# Patient Record
Sex: Female | Born: 1960 | Race: White | Hispanic: No | Marital: Married | Smoking: Never smoker
Health system: Southern US, Community
[De-identification: ages and names within clinical notes are randomized; demographics above are authoritative.]

## PROBLEM LIST (undated history)

## (undated) DIAGNOSIS — J189 Pneumonia, unspecified organism: Secondary | ICD-10-CM

## (undated) DIAGNOSIS — N809 Endometriosis, unspecified: Secondary | ICD-10-CM

## (undated) DIAGNOSIS — I456 Pre-excitation syndrome: Secondary | ICD-10-CM

## (undated) HISTORY — DX: Pre-excitation syndrome: I45.6

## (undated) HISTORY — DX: Endometriosis, unspecified: N80.9

## (undated) HISTORY — DX: Pneumonia, unspecified organism: J18.9

---

## 1998-08-05 ENCOUNTER — Encounter: Admission: RE | Admit: 1998-08-05 | Discharge: 1998-08-28 | Payer: Self-pay | Admitting: Internal Medicine

## 1998-11-17 ENCOUNTER — Encounter: Payer: Self-pay | Admitting: Internal Medicine

## 1998-11-17 ENCOUNTER — Ambulatory Visit (HOSPITAL_COMMUNITY): Admission: RE | Admit: 1998-11-17 | Discharge: 1998-11-17 | Payer: Self-pay | Admitting: Internal Medicine

## 1999-05-04 HISTORY — PX: OTHER SURGICAL HISTORY: SHX169

## 1999-05-28 ENCOUNTER — Emergency Department (HOSPITAL_COMMUNITY): Admission: EM | Admit: 1999-05-28 | Discharge: 1999-05-28 | Payer: Self-pay | Admitting: Emergency Medicine

## 1999-05-28 ENCOUNTER — Encounter: Payer: Self-pay | Admitting: Emergency Medicine

## 1999-09-03 ENCOUNTER — Other Ambulatory Visit: Admission: RE | Admit: 1999-09-03 | Discharge: 1999-09-03 | Payer: Self-pay | Admitting: *Deleted

## 1999-09-21 ENCOUNTER — Encounter: Payer: Self-pay | Admitting: *Deleted

## 1999-09-21 ENCOUNTER — Ambulatory Visit (HOSPITAL_COMMUNITY): Admission: RE | Admit: 1999-09-21 | Discharge: 1999-09-21 | Payer: Self-pay | Admitting: *Deleted

## 2000-10-21 ENCOUNTER — Ambulatory Visit (HOSPITAL_COMMUNITY): Admission: RE | Admit: 2000-10-21 | Discharge: 2000-10-21 | Payer: Self-pay | Admitting: Internal Medicine

## 2001-04-19 ENCOUNTER — Emergency Department (HOSPITAL_COMMUNITY): Admission: EM | Admit: 2001-04-19 | Discharge: 2001-04-19 | Payer: Self-pay

## 2001-05-03 HISTORY — PX: VAGINAL HYSTERECTOMY: SUR661

## 2001-05-23 ENCOUNTER — Other Ambulatory Visit: Admission: RE | Admit: 2001-05-23 | Discharge: 2001-05-23 | Payer: Self-pay | Admitting: *Deleted

## 2001-05-29 ENCOUNTER — Encounter: Admission: RE | Admit: 2001-05-29 | Discharge: 2001-05-29 | Payer: Self-pay | Admitting: *Deleted

## 2001-05-29 ENCOUNTER — Encounter: Payer: Self-pay | Admitting: *Deleted

## 2005-07-08 ENCOUNTER — Encounter: Admission: RE | Admit: 2005-07-08 | Discharge: 2005-07-08 | Payer: Self-pay | Admitting: *Deleted

## 2006-12-06 ENCOUNTER — Other Ambulatory Visit: Admission: RE | Admit: 2006-12-06 | Discharge: 2006-12-06 | Payer: Self-pay | Admitting: *Deleted

## 2008-03-26 ENCOUNTER — Ambulatory Visit: Payer: Self-pay | Admitting: Internal Medicine

## 2008-08-26 ENCOUNTER — Ambulatory Visit: Payer: Self-pay | Admitting: Family Medicine

## 2008-08-26 DIAGNOSIS — F39 Unspecified mood [affective] disorder: Secondary | ICD-10-CM | POA: Insufficient documentation

## 2008-08-26 DIAGNOSIS — F409 Phobic anxiety disorder, unspecified: Secondary | ICD-10-CM | POA: Insufficient documentation

## 2008-08-26 DIAGNOSIS — J309 Allergic rhinitis, unspecified: Secondary | ICD-10-CM | POA: Insufficient documentation

## 2008-09-11 ENCOUNTER — Ambulatory Visit: Payer: Self-pay | Admitting: Family Medicine

## 2008-09-11 DIAGNOSIS — G43909 Migraine, unspecified, not intractable, without status migrainosus: Secondary | ICD-10-CM | POA: Insufficient documentation

## 2008-09-17 ENCOUNTER — Telehealth (INDEPENDENT_AMBULATORY_CARE_PROVIDER_SITE_OTHER): Payer: Self-pay | Admitting: *Deleted

## 2008-10-23 ENCOUNTER — Ambulatory Visit: Payer: Self-pay | Admitting: Family Medicine

## 2008-10-23 ENCOUNTER — Encounter: Admission: RE | Admit: 2008-10-23 | Discharge: 2008-10-23 | Payer: Self-pay | Admitting: Family Medicine

## 2008-10-23 DIAGNOSIS — IMO0002 Reserved for concepts with insufficient information to code with codable children: Secondary | ICD-10-CM | POA: Insufficient documentation

## 2008-12-24 ENCOUNTER — Ambulatory Visit: Payer: Self-pay | Admitting: Family Medicine

## 2009-01-24 ENCOUNTER — Ambulatory Visit: Payer: Self-pay | Admitting: Family Medicine

## 2009-01-28 ENCOUNTER — Ambulatory Visit: Payer: Self-pay | Admitting: Family Medicine

## 2009-02-25 ENCOUNTER — Ambulatory Visit: Payer: Self-pay | Admitting: Family Medicine

## 2009-02-26 ENCOUNTER — Encounter: Payer: Self-pay | Admitting: Family Medicine

## 2009-03-17 ENCOUNTER — Ambulatory Visit: Payer: Self-pay | Admitting: Family Medicine

## 2009-05-09 ENCOUNTER — Ambulatory Visit: Payer: Self-pay | Admitting: Family Medicine

## 2009-05-16 ENCOUNTER — Telehealth: Payer: Self-pay | Admitting: Family Medicine

## 2009-06-17 ENCOUNTER — Ambulatory Visit: Payer: Self-pay | Admitting: Family Medicine

## 2009-06-30 ENCOUNTER — Ambulatory Visit: Payer: Self-pay | Admitting: Family Medicine

## 2009-06-30 DIAGNOSIS — M549 Dorsalgia, unspecified: Secondary | ICD-10-CM | POA: Insufficient documentation

## 2009-07-16 ENCOUNTER — Ambulatory Visit: Payer: Self-pay | Admitting: Family Medicine

## 2009-07-23 ENCOUNTER — Encounter: Payer: Self-pay | Admitting: Family Medicine

## 2009-12-05 IMAGING — CR DG SCAPULA*R*
2 series · 2 of 2 positions shown · non-contrast
Comparison: None

CLINICAL DATA: Injury.  Sudden onset of pain in right scapula
today.

RIGHT SCAPULA

[view not recorded (1 of 2)]
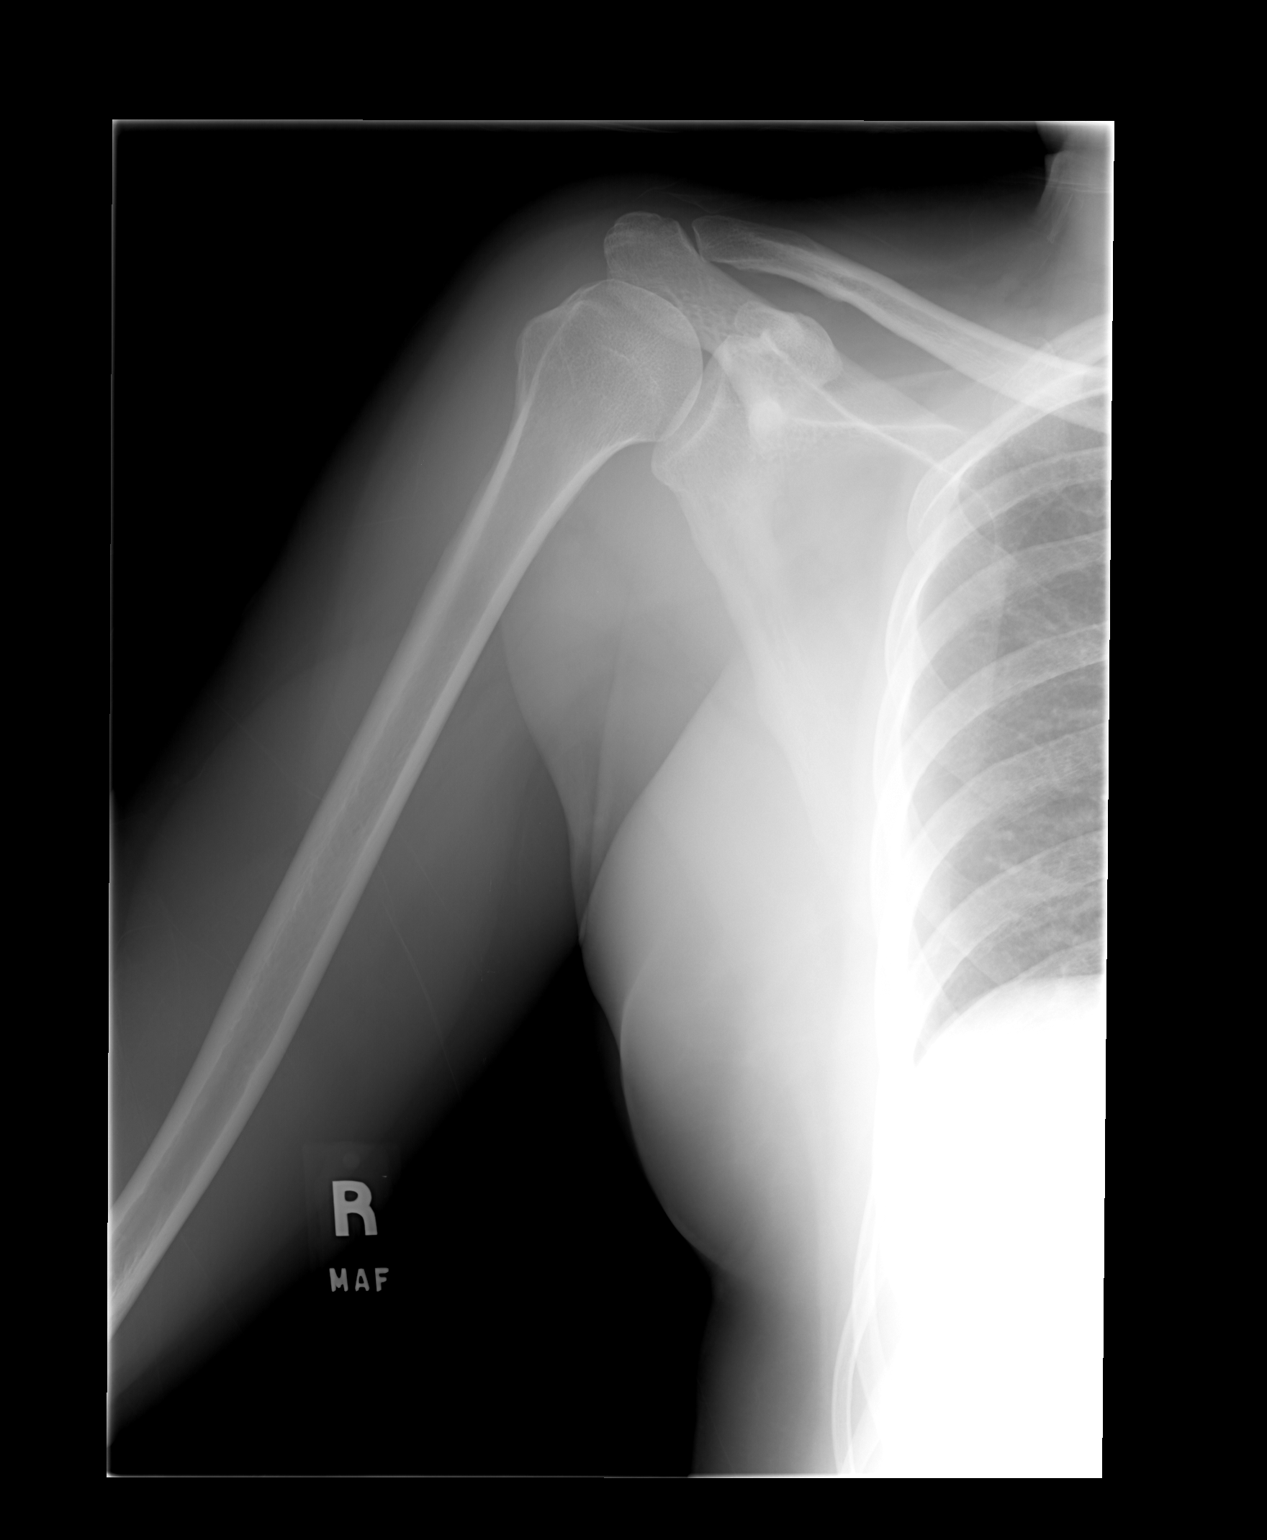

[view not recorded (2 of 2)]
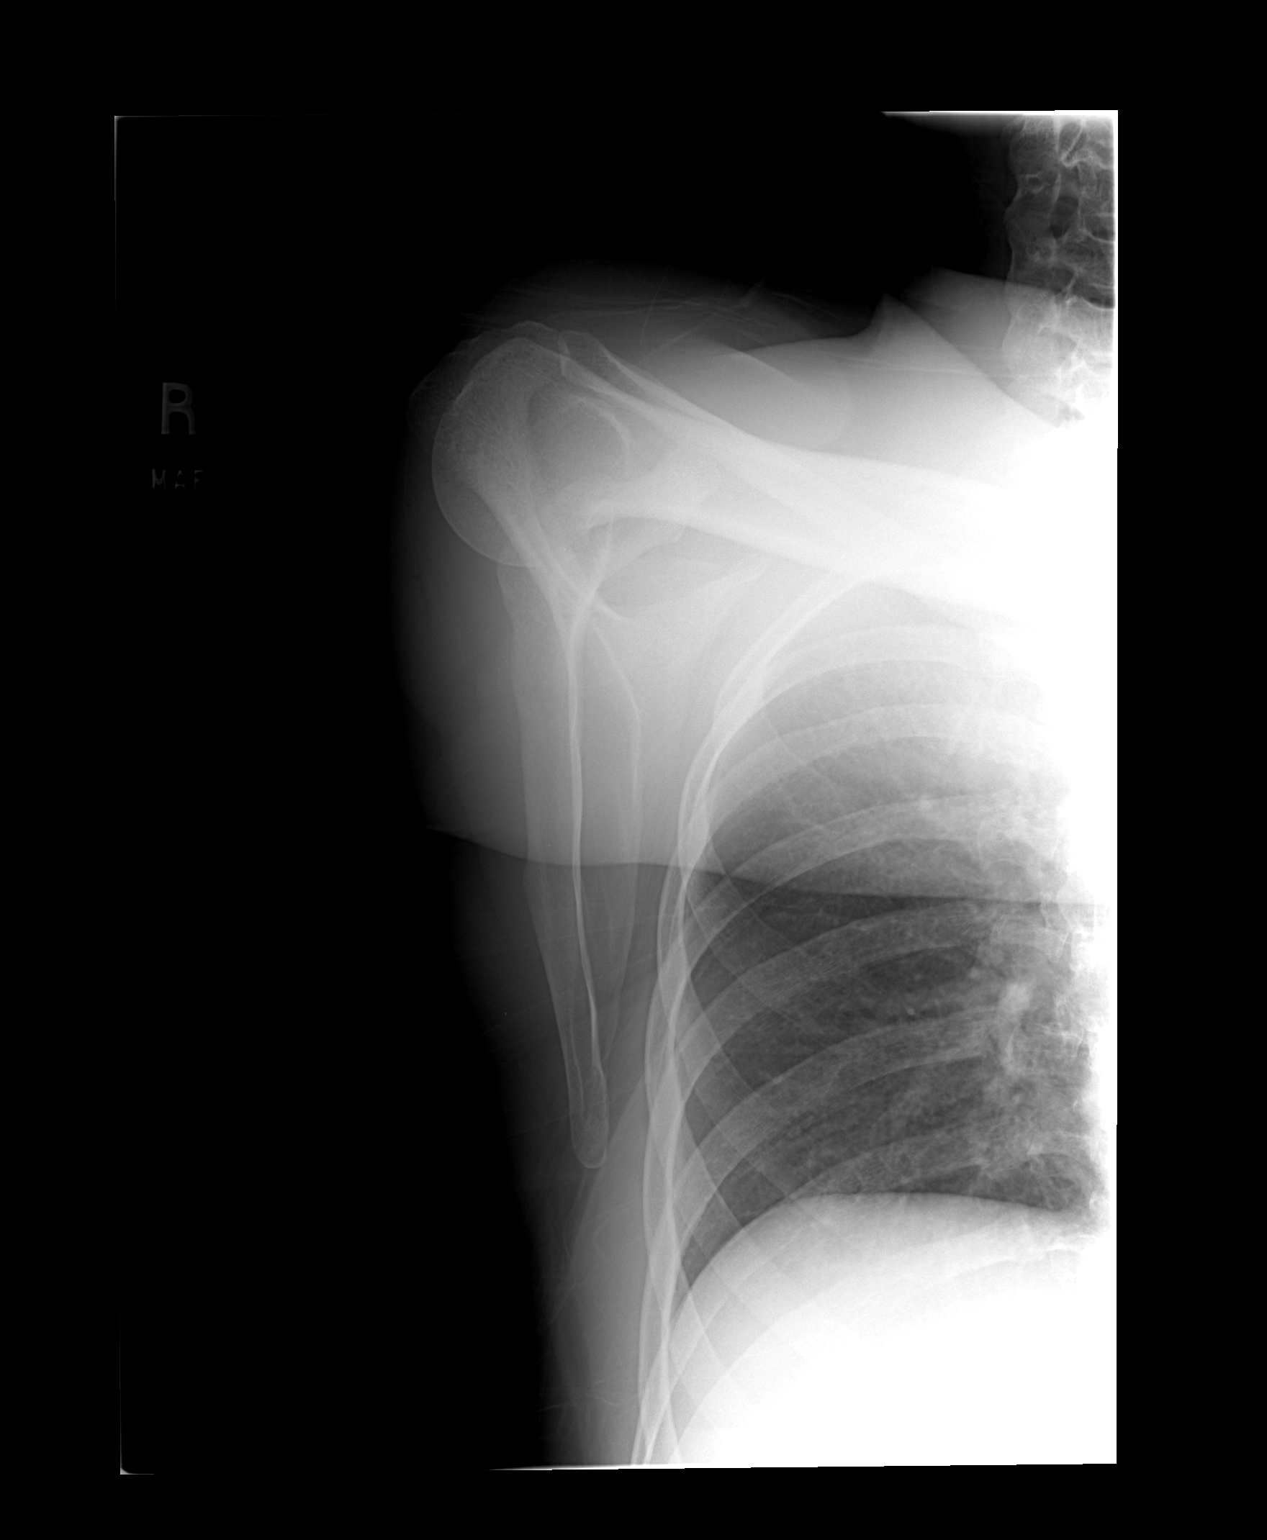

[2 of 2 positions shown; findings below may reference images not displayed]

FINDINGS: No fracture or dislocation.
IMPRESSION: Negative right scapula.

## 2010-01-12 ENCOUNTER — Ambulatory Visit: Payer: Self-pay | Admitting: Family Medicine

## 2010-01-12 ENCOUNTER — Encounter
Admission: RE | Admit: 2010-01-12 | Discharge: 2010-01-12 | Payer: Self-pay | Source: Home / Self Care | Admitting: Family Medicine

## 2010-01-14 ENCOUNTER — Telehealth: Payer: Self-pay | Admitting: Family Medicine

## 2010-01-14 ENCOUNTER — Encounter (INDEPENDENT_AMBULATORY_CARE_PROVIDER_SITE_OTHER): Payer: Self-pay | Admitting: *Deleted

## 2010-01-27 ENCOUNTER — Ambulatory Visit: Payer: Self-pay | Admitting: Family Medicine

## 2010-02-24 ENCOUNTER — Ambulatory Visit: Payer: Self-pay | Admitting: Family Medicine

## 2010-04-20 ENCOUNTER — Ambulatory Visit: Payer: Self-pay | Admitting: Family Medicine

## 2010-04-20 ENCOUNTER — Encounter
Admission: RE | Admit: 2010-04-20 | Discharge: 2010-04-20 | Payer: Self-pay | Source: Home / Self Care | Attending: Family Medicine | Admitting: Family Medicine

## 2010-04-20 DIAGNOSIS — M79609 Pain in unspecified limb: Secondary | ICD-10-CM | POA: Insufficient documentation

## 2010-04-21 ENCOUNTER — Encounter (INDEPENDENT_AMBULATORY_CARE_PROVIDER_SITE_OTHER): Payer: Self-pay | Admitting: *Deleted

## 2010-04-29 ENCOUNTER — Ambulatory Visit
Admission: RE | Admit: 2010-04-29 | Discharge: 2010-04-29 | Payer: Self-pay | Source: Home / Self Care | Attending: Family Medicine | Admitting: Family Medicine

## 2010-05-01 ENCOUNTER — Encounter: Payer: Self-pay | Admitting: Family Medicine

## 2010-05-24 ENCOUNTER — Encounter: Payer: Self-pay | Admitting: Internal Medicine

## 2010-05-26 ENCOUNTER — Ambulatory Visit
Admission: RE | Admit: 2010-05-26 | Discharge: 2010-05-26 | Payer: Self-pay | Source: Home / Self Care | Admitting: Emergency Medicine

## 2010-05-27 ENCOUNTER — Encounter: Payer: Self-pay | Admitting: Emergency Medicine

## 2010-05-29 ENCOUNTER — Telehealth (INDEPENDENT_AMBULATORY_CARE_PROVIDER_SITE_OTHER): Payer: Self-pay | Admitting: *Deleted

## 2010-06-03 HISTORY — PX: FOOT SURGERY: SHX648

## 2010-06-04 NOTE — Assessment & Plan Note (Signed)
Summary: Upper back pain f/u.    Vital Signs:  Patient profile:   50 year old female Height:      69 inches Weight:      201 pounds Pulse rate:   84 / minute BP sitting:   130 / 82  (right arm) Cuff size:   regular  Vitals Entered By: Avon Gully CMA, Duncan Dull) (January 27, 2010 2:04 PM) CC: f/u back pain    Primary Care Provider:  Nani Gasser MD  CC:  f/u back pain .  History of Present Illness:  Pain in the neck and to the right shoulder. Arm is not as painful.  would like to see a chiropracter, Dr. Gerre Couch.  Feels about 40% better with rest and medications.  Using the Advil primarily. Occ using the med at bedtime.  Out of the tamezepam but refilled yesterday.   Current Medications (verified): 1)  Temazepam 30 Mg Caps (Temazepam) .... Take 1 Tablet By Mouth Once A Day As Needed Sleep 2)  Allegra 180 Mg Tabs (Fexofenadine Hcl) .... Take 1 Tablet By Mouth Once A Day 3)  Maxalt 10 Mg Tabs (Rizatriptan Benzoate) .... Use As Needed For Migraines 4)  Phenergan 25mg /0.40ml Gel .... Apply 0.11ml  To Wrist Every 4-6 Hours As Needed Nausea 5)  Flexeril 10 Mg Tabs (Cyclobenzaprine Hcl) .... 1/2 To 1 Tab At Bedtime As Needed Muscle Strain. 6)  Hydrocodone-Acetaminophen 5-325 Mg Tabs (Hydrocodone-Acetaminophen) .Marland Kitchen.. 1-2 Tabs By Mouth Three Times A Day As Needed For Severe Pain.  Allergies (verified): 1)  ! Biaxin 2)  ! Imitrex  Comments:  Nurse/Medical Assistant: The patient's medications and allergies were reviewed with the patient and were updated in the Medication and Allergy Lists. Avon Gully CMA, Duncan Dull) (January 27, 2010 2:06 PM)  Physical Exam  General:  Well-developed,well-nourished,in no acute distress; alert,appropriate and cooperative throughout examination Msk:  Neck with NROM. Right shoulder with slight dec internal rotation compared to the lef. Tender over the right trap and along the medial edge of the scapula.    Impression &  Recommendations:  Problem # 1:  BACK PAIN, UPPER (ICD-724.5) Will refer to chiropracter. I also think she would benefit from massage. If not progressing then consider PT or orhto evalution.  Refill her hydrocodone once more time. She is not really using the flexeril.  Can return to work.  The following medications were removed from the medication list:    Flexeril 10 Mg Tabs (Cyclobenzaprine hcl) .Marland Kitchen... 1/2 to 1 tab at bedtime as needed muscle strain. Her updated medication list for this problem includes:    Hydrocodone-acetaminophen 5-325 Mg Tabs (Hydrocodone-acetaminophen) .Marland Kitchen... 1-2 tabs by mouth three times a day as needed for severe pain.  Orders: Chiropractic Referral ([facility]) T-Culture, Urine [phone])  Complete Medication List: 1)  Temazepam 30 Mg Caps (Temazepam) .... Take 1 tablet by mouth once a day as needed sleep 2)  Allegra 180 Mg Tabs (Fexofenadine hcl) .... Take 1 tablet by mouth once a day 3)  Maxalt 10 Mg Tabs (Rizatriptan benzoate) .... Use as needed for migraines 4)  Phenergan 25mg /0.51ml Gel  .... Apply 0.87ml  to wrist every 4-6 hours as needed nausea 5)  Hydrocodone-acetaminophen 5-325 Mg Tabs (Hydrocodone-acetaminophen) .Marland Kitchen.. 1-2 tabs by mouth three times a day as needed for severe pain. Prescriptions: HYDROCODONE-ACETAMINOPHEN 5-325 MG TABS (HYDROCODONE-ACETAMINOPHEN) 1-2 tabs by mouth three times a day as needed for severe pain.  #45 x 0   Entered and Authorized by:   Nani Gasser MD  Signed by:   Nani Gasser MD on 01/27/2010   Method used:   Printed then faxed to ...       [facility] [phone]* (retail)       [address] [place], [place]  [address]       Ph: [phone]       Fax: [phone]   RxID:   [id number]

## 2010-06-04 NOTE — Letter (Signed)
Summary: Out of Work  [facility]  [address]   [place], [place] [address]   Phone: [phone]  Fax: [phone]    January 27, 2010   Employee:  ADALENE GULOTTA    To Whom It May Concern:   For Medical reasons, she can return to work 01-28-2010  If you need additional information, please feel free to contact our office.         Sincerely,    Nani Gasser MD

## 2010-06-04 NOTE — Assessment & Plan Note (Signed)
Summary: POSSIBLE UTI?   Vital Signs:  Patient Profile:   50 Years Old Female CC:      Dysuria, vomiting, LBP x 2 days Height:     69 inches Weight:      190 pounds O2 Sat:      100 % O2 treatment:    Room Air Temp:     97.9 degrees F oral Pulse rate:   63 / minute Pulse rhythm:   regular Resp:     16 per minute BP sitting:   124 / 79  (left arm) Cuff size:   regular  Vitals Entered By: Emilio Math (May 26, 2010 6:21 PM)                  Current Allergies: ! BIAXIN ! IMITREXHistory of Present Illness History from: patient Chief Complaint: Dysuria, vomiting, LBP x 2 days History of Present Illness: 50 Years Old Female complains of UTI symptoms for 1 day.  She describes the pain as burning during urination.  Azo helps a little bit. + dysuria + frequency + urgency No hematuria No vaginal discharge + fever/chills + lower abdomenal pain + back pain No fatigue  + nausea  REVIEW OF SYSTEMS Constitutional Symptoms      Denies fever, chills, night sweats, weight loss, weight gain, and fatigue.  Eyes       Denies change in vision, eye pain, eye discharge, glasses, contact lenses, and eye surgery. Ear/Nose/Throat/Mouth       Denies hearing loss/aids, change in hearing, ear pain, ear discharge, dizziness, frequent runny nose, frequent nose bleeds, sinus problems, sore throat, hoarseness, and tooth pain or bleeding.  Respiratory       Denies dry cough, productive cough, wheezing, shortness of breath, asthma, bronchitis, and emphysema/COPD.  Cardiovascular       Denies murmurs, chest pain, and tires easily with exhertion.    Gastrointestinal       Complains of nausea/vomiting.      Denies stomach pain, diarrhea, constipation, blood in bowel movements, and indigestion. Genitourniary       Complains of painful urination.      Denies kidney stones and loss of urinary control. Neurological       Denies paralysis, seizures, and fainting/blackouts. Musculoskeletal  Denies muscle pain, joint pain, joint stiffness, decreased range of motion, redness, swelling, muscle weakness, and gout.  Skin       Denies bruising, unusual mles/lumps or sores, and hair/skin or nail changes.  Psych       Denies mood changes, temper/anger issues, anxiety/stress, speech problems, depression, and sleep problems.  Past History:  Past Medical History: Reviewed history from 08/26/2008 and no changes required. 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 but [facility] has now retired.   Past Surgical History: Reviewed history from 08/26/2008 and no changes required. Hysterectomy  01/1997  Family History: Reviewed history from 08/26/2008 and no changes required. Uncle with MI  Social History: Reviewed history from 02/25/2009 and no changes required. Working at [employer] for [employer] at [employer]. Previously worked at [employer]. BS in Business Admin.  Married to [place] with 2 adult kids.   Never Smoked Drug use-no Regular exercise-yes, walking.  Alcohol use-no Physical Exam General appearance: well developed, well nourished, mild distress Chest/Lungs: no rales, wheezes, or rhonchi bilateral, breath sounds equal without effort Heart: regular rate and  rhythm, no murmur Abdomen:  mild suprapubic tenderness, no guarding, no rebound Back: mild Bil CVAT Assessment New Problems: URINARY TRACT INFECTION (ICD-599.0)   Plan New Medications/Changes: PHENAZOPYRIDINE HCL 200 MG TABS (PHENAZOPYRIDINE HCL) 1 by mouth three times a day for 2 days  #6 x 0, 05/26/2010, Hoyt Koch MD CIPROFLOXACIN HCL 250 MG TABS (CIPROFLOXACIN HCL) 1 by mouth two times a day for 5 days  #10 x 0, 05/26/2010, Hoyt Koch MD  New Orders: Est. Patient Level III [56213] UA Dipstick w/o Micro (automated)  [81003] T-Culture, Urine  [[id number]] Planning Comments:   Hydration, rest, Motrin as needed, [place] Follow-up with your primary care physician if not improving or if getting worse   The patient and/or caregiver has been counseled thoroughly with regard to medications prescribed including dosage, schedule, interactions, rationale for use, and possible side effects and they verbalize understanding.  Diagnoses and expected course of recovery discussed and will return if not improved as expected or if the condition worsens. Patient and/or caregiver verbalized understanding.  Prescriptions: PHENAZOPYRIDINE HCL 200 MG TABS (PHENAZOPYRIDINE HCL) 1 by mouth three times a day for 2 days  #6 x 0   Entered and Authorized by:   Hoyt Koch MD   Signed by:   Hoyt Koch MD on 05/26/2010   Method used:   Handwritten   RxID:   [id number] CIPROFLOXACIN HCL 250 MG TABS (CIPROFLOXACIN HCL) 1 by mouth two times a day for 5 days  #10 x 0   Entered and Authorized by:   Hoyt Koch MD   Signed by:   Hoyt Koch MD on 05/26/2010   Method used:   Handwritten   RxID:   [id number]   Orders Added: 1)  Est. Patient Level III [63875] 2)  UA Dipstick w/o Micro (automated)  [81003] 3)  T-Culture, Urine [[id number]]

## 2010-06-04 NOTE — Assessment & Plan Note (Signed)
Summary: Lt. Ankle swollen   Vital Signs:  Patient profile:   50 year old female Height:      69 inches Weight:      198 pounds Pulse rate:   74 / minute BP sitting:   93 / 58  (right arm) Cuff size:   regular  Vitals Entered By: Avon Gully CMA, Duncan Dull) (April 20, 2010 3:16 PM) CC: left ankle swollen and painful x 2 months   Primary Care Daxter Paule:  Nani Gasser MD  CC:  left ankle swollen and painful x 2 months.  History of Present Illness: left ankle swollen and painful x 2 months. Swelling  on the outside of left the ankle.   Says occ the area turns blue. GEts really swollen by the end of the day. PUt ICE on it and taking Advil.  Works 6 days a week and has to stand on her feet.  Fells stiff. Feels like she is dragging her foot around.  No trauma or injury.  No old injuries or fracuturs. Hx of arthroscopy of the left knee but years ago. It is better when she elevates the foot.;   Current Medications (verified): 1)  Temazepam 30 Mg Caps (Temazepam) .... Take 1 Tablet By Mouth Once A Day As Needed Sleep 2)  Allegra 180 Mg Tabs (Fexofenadine Hcl) .... Take 1 Tablet By Mouth Once A Day 3)  Maxalt 10 Mg Tabs (Rizatriptan Benzoate) .... Use As Needed For Migraines 4)  Phenergan 25mg /0.38ml Gel .... Apply 0.38ml  To Wrist Every 4-6 Hours As Needed Nausea  Allergies (verified): 1)  ! Biaxin 2)  ! Imitrex  Comments:  Nurse/Medical Assistant: The patient's medications and allergies were reviewed with the patient and were updated in the Medication and Allergy Lists. Avon Gully CMA, Duncan Dull) (April 20, 2010 3:17 PM)  Social History: Reviewed history from 02/25/2009 and no changes required. Working at [employer] for [employer] at [employer]. Previously worked at [employer]. BS in Business Admin.  Married to [place] with 2 adult kids.   Never Smoked Drug use-no Regular exercise-yes, walking.  Alcohol use-no  Physical Exam  General:  Well-developed,well-nourished,in no acute  distress; alert,appropriate and cooperative throughout examination Msk:  Left ankle with slightly dec flexion. Pain with all ROM against resistance. Some mild swelling over teh lateral malleolus. she is tender over the outside of the ankle. No bruising.    Impression & Recommendations:  Problem # 1:  ANKLE PAIN, LEFT (ICD-719.47) Will get an xray to rule out fracture PRICE for now. REally ther ankle looks like  a sprain. No sign of gout adn the 2 months hx is inconsistant. Can use Advil as needed.  Orders: T-DG Ankle Complete*L* (57846)  Complete Medication List: 1)  Temazepam 30 Mg Caps (Temazepam) .... Take 1 tablet by mouth once a day as needed sleep 2)  Allegra 180 Mg Tabs (Fexofenadine hcl) .... Take 1 tablet by mouth once a day 3)  Maxalt 10 Mg Tabs (Rizatriptan benzoate) .... Use as needed for migraines 4)  Phenergan 25mg /0.55ml Gel  .... Apply 0.26ml  to wrist every 4-6 hours as needed nausea  Patient Instructions: 1)  Elevate, ACE wrap, and continue to ice it.  2)  We will call you with the xray results.    Orders Added: 1)  T-DG Ankle Complete*L* [73610] 2)  Est. Patient Level IV [96295]

## 2010-06-04 NOTE — Letter (Signed)
Summary: Out of Work  [facility]  [address]   [place], [place] [address]   Phone: [phone]  Fax: [phone]    July 16, 2009   Employee:  ALECIA DOI    To Whom It May Concern:   For Medical reasons, please excuse the above named employee from work for the following dates:  Start:   07/15/2009  End:   07/16/2009  If you need additional information, please feel free to contact our office.         Sincerely,    Nani Gasser, MD

## 2010-06-04 NOTE — Consult Note (Signed)
Summary: [facility]  [facility]   Imported By: Lanelle Bal 05/27/2010 09:54:51  _____________________________________________________________________  External Attachment:    Type:   Image     Comment:   External Document

## 2010-06-04 NOTE — Letter (Signed)
Summary: Generic Letter  [facility]  [address]   [place], [place] [address]   Phone: [phone]  Fax: [phone]    04/21/2010  NELI FOFANA [address] [place], [place]  [address]  Dear Ms. Vlachos,    Please allow this patient to be off of her feet as much as possible for the next two weeks. If you have questions please call our office at [phone].       Sincerely,   Nani Gasser, MD

## 2010-06-04 NOTE — Assessment & Plan Note (Signed)
Summary: Upper back pain   Vital Signs:  Patient profile:   50 year old female Height:      69 inches Weight:      201 pounds Pulse rate:   80 / minute BP sitting:   126 / 83  (left arm) Cuff size:   regular  Vitals Entered By: Avon Gully CMA, Duncan Dull) (January 12, 2010 11:47 AM) CC: hurt back at work yesterday lifting a drawer, rt shoulder and arm hurt Pain Assessment Patient in pain? yes     Location: upper back Intensity: 9 Type: burning   Primary Care Miho Monda:  Nani Gasser MD  CC:  hurt back at work yesterday lifting a drawer and rt shoulder and arm hurt.  History of Present Illness: hurt back at work yesterday lifting a drawer, rt shoulder and arm hurt.  Works at the [employer], operational.  Draw fell out so tried to pick it up and felt something immedialy.  Used Advil and iced it all not.  Getting burning sensation in the right shoulder down to her elbow. Some tinlging in her right hand and feels like his her funny bond.  Painful when turns to the right.     Current Medications (verified): 1)  Temazepam 30 Mg Caps (Temazepam) .... Take 1 Tablet By Mouth Once A Day As Needed Sleep 2)  Allegra 180 Mg Tabs (Fexofenadine Hcl) .... Take 1 Tablet By Mouth Once A Day 3)  Maxalt 10 Mg Tabs (Rizatriptan Benzoate) .... Use As Needed For Migraines 4)  Phenergan 25mg /0.64ml Gel .... Apply 0.65ml  To Wrist Every 4-6 Hours As Needed Nausea  Allergies (verified): 1)  ! Biaxin 2)  ! Imitrex  Comments:  Nurse/Medical Assistant: The patient's medications and allergies were reviewed with the patient and were updated in the Medication and Allergy Lists. Avon Gully CMA, Duncan Dull) (January 12, 2010 11:48 AM)  Physical Exam  General:  Well-developed,well-nourished,in no acute distress; alert,appropriate and cooperative throughout examination Msk:  TEnder over teh upper thoracic spine and the right paraspinous muscles. TEnder over the lateral edge of acromium. Nontender  elbow and wrist. DEc internal rotation.  + empty can test on the right.  Shoudler extension to about 90 degree.  Strength shoulder 4/4, elbow and wrsit 5/5.     Impression & Recommendations:  Problem # 1:  BACK PAIN, UPPER (ICD-724.5) Discussed likey MSK but may have rotator cuff syndrome. Given tramadol in the office for pain relief. Off work today.  New rx for flexeril and as needed hydrocodone. Can use the IBU.  Work on heat and gentle stretches.  Xray tor rule out fracture or large disc herniation.  The following medications were removed from the medication list:    Hydrocodone-acetaminophen 5-500 Mg Tabs (Hydrocodone-acetaminophen) .Marland Kitchen... 1-2 tabs by mouth every 6 hours as needed for severe pain. Her updated medication list for this problem includes:    Flexeril 10 Mg Tabs (Cyclobenzaprine hcl) .Marland Kitchen... 1/2 to 1 tab at bedtime as needed muscle strain.    Hydrocodone-acetaminophen 5-325 Mg Tabs (Hydrocodone-acetaminophen) .Marland Kitchen... 1-2 tabs by mouth three times a day as needed for severe pain.  Orders: T-DG Cervical Spine 2-3 Views [phone]) T-DG Thoracic Spine 2 Views [phone]) T-DG Shoulder*R* [phone]) Ketorolac-Toradol 15mg  (N5621)  Complete Medication List: 1)  Temazepam 30 Mg Caps (Temazepam) .... Take 1 tablet by mouth once a day as needed sleep 2)  Allegra 180 Mg Tabs (Fexofenadine hcl) .... Take 1 tablet by mouth once a day 3)  Maxalt 10 Mg Tabs (  Rizatriptan benzoate) .... Use as needed for migraines 4)  Phenergan 25mg /0.50ml Gel  .... Apply 0.41ml  to wrist every 4-6 hours as needed nausea 5)  Flexeril 10 Mg Tabs (Cyclobenzaprine hcl) .... 1/2 to 1 tab at bedtime as needed muscle strain. 6)  Hydrocodone-acetaminophen 5-325 Mg Tabs (Hydrocodone-acetaminophen) .Marland Kitchen.. 1-2 tabs by mouth three times a day as needed for severe pain.  Other Orders: Flu Vaccine 76yrs + ([address]) Admin 1st Vaccine ([address])  Patient Instructions: 1)  Continue your ibuprofen 600mg  by mouth three times a day wth  food 2)  Try the muscle relacer in the evenings 3)  Can use the hydrocodone as needed for severe pain. Be careful of sedation.  4)  F/U in 2 weeks.  5)  Apply warm moist heat for 15 min 3-4 x a day.  6)  Gentle stretches.  Prescriptions: HYDROCODONE-ACETAMINOPHEN 5-325 MG TABS (HYDROCODONE-ACETAMINOPHEN) 1-2 tabs by mouth three times a day as needed for severe pain.  #45 x 0   Entered and Authorized by:   Nani Gasser MD   Signed by:   Nani Gasser MD on 01/12/2010   Method used:   Printed then faxed to ...       [facility] [phone]* (retail)       [address] [place], [place]  [address]       Ph: [phone]       Fax: [phone]   RxID:   [id number] FLEXERIL 10 MG TABS (CYCLOBENZAPRINE HCL) 1/2 to 1 tab at bedtime as needed muscle strain.  #30 x 0   Entered and Authorized by:   Nani Gasser MD   Signed by:   Nani Gasser MD on 01/12/2010   Method used:   Electronically to        [facility] [phone]* (retail)       [address] [place], [place]  [address]       Ph: [phone]       Fax: [phone]   RxID:   [id number]    Medication Administration  Injection # 1:    Medication: Ketorolac-Toradol 15mg     Diagnosis: BACK PAIN, UPPER (ICD-724.5)    Route: IM    Site: RUOQ gluteus    Exp Date: 04/03/2011    Lot #: [id number]    Mfr: Hospira    Comments: 60mg /ml given    Patient tolerated injection without complications    Given by: Avon Gully CMA, Duncan Dull) (January 12, 2010 12:17 PM)  Orders Added: 1)  T-DG Cervical Spine 2-3 Views [72040] 2)  T-DG Thoracic Spine 2 Views [72070] 3)  T-DG Shoulder*R* [73030] 4)  Ketorolac-Toradol 15mg  [J1885] 5)  Flu Vaccine 67yrs + [90[address]] 6)  Admin 1st Vaccine [90471] 7)  Est. Patient Level IV [35573]    Immunizations Administered:  Influenza Vaccine # 1:    Vaccine Type: Fluvax 3+    Site: left deltoid    Mfr: Fluarix    Dose: 0.5 ml    Route: IM     Given by: Sue Lush McCrimmon CMA, (AAMA)    Exp. Date: 10/31/2010    Lot #: [id number]    VIS given: 11/25/09 version given January 12, 2010.  Flu Vaccine Consent Questions:    Do you have a history of severe allergic reactions to this vaccine? no    Any prior history of allergic reactions to egg and/or gelatin? no  Do you have a sensitivity to the preservative Thimersol? no    Do you have a past history of Guillan-Barre Syndrome? no    Do you currently have an acute febrile illness? no    Have you ever had a severe reaction to latex? no    Vaccine information given and explained to patient? no    Are you currently pregnant? no

## 2010-06-04 NOTE — Assessment & Plan Note (Signed)
Summary: migraine   Vital Signs:  Patient profile:   50 year old female Height:      69 inches Weight:      203 pounds BMI:     30.09 O2 Sat:      100 % on Room air Temp:     98.0 degrees F oral Pulse rate:   62 / minute BP sitting:   120 / 82 Cuff size:   regular  Vitals Entered By: Jessica Alexander CMA/april (June 17, 2009 3:17 PM)  O2 Flow:  Room air CC: c/o migraine HA, N/V x 2 days   Primary Care Provider:  Nani Gasser MD  CC:  c/o migraine HA and N/V x 2 days.  History of Present Illness: 50 yo WF presents for a migraine HA that she woke up with yesterday.  She took 2 Maxalts yesterday and had N/V and took 2 today.  It has not helped.  She ran out of her phenergan.  She has been on imipramine at bedtime for migraine prevention.  She used to see Jessica Alexander.  Avging 4 migraines/ month which usually improve on Maxalt.     Current Medications (verified): 1)  Temazepam 30 Mg Caps (Temazepam) .... Take 1 Tablet By Mouth Once A Day As Needed Sleep 2)  Allegra 180 Mg Tabs (Fexofenadine Hcl) .... Take 1 Tablet By Mouth Once A Day 3)  Imipramine Hcl 50 Mg Tabs (Imipramine Hcl) .... Take 1 Tablet By Mouth Once A Day At Bedtime 4)  Pristiq 50 Mg Xr24h-Tab (Desvenlafaxine Succinate) .Marland Kitchen.. 1 By Mouth Daily 5)  Maxalt 10 Mg Tabs (Rizatriptan Benzoate) .... Use As Needed For Migraines 6)  Promethazine Hcl 25 Mg Tabs (Promethazine Hcl) .... One By Mouth Every 6 Hours As Needed Nausea/vomiting.  Allergies (verified): 1)  ! Biaxin 2)  ! Imitrex  Past History:  Past Medical History: Reviewed history from 08/26/2008 and no changes required. Current Problems:  MOOD DISORDER (ICD-296.90) ALLERGIC RHINITIS (ICD-477.9) MIGRAINE W/AURA W/O INTRACT W/O STATUS MIGRNOSUS (ICD-346.00) - Previoulsy saw Jessica[facility] but he has now retired.   Social History: Reviewed history from 02/25/2009 and no changes required. Working at [employer] for [employer] at  [employer]. Previously worked at [employer]. BS in Business Admin.  Married to Jessica Alexander with 2 adult kids.   Never Smoked Drug use-no Regular exercise-yes, walking.  Alcohol use-no  Review of Systems      See HPI  Physical Exam  General:  alert, well-developed, well-nourished, and well-hydrated.   Head:  normocephalic and atraumatic.   Eyes:  photosensitive Nose:  no nasal discharge.   Mouth:  pharynx pink and moist.   Neck:  no masses.   Lungs:  Normal respiratory effort, chest expands symmetrically. Lungs are clear to auscultation, no crackles or wheezes. Heart:  Normal rate and regular rhythm. S1 and S2 normal without gallop, murmur, click, rub or other extra sounds. Skin:  color normal.   Psych:  flat affect.     Impression & Recommendations:  Problem # 1:  MIGRAINE HEADACHE (ICD-346.90)  Day 2 of severe migraine.  Treat with 60 mg IM Toradol injection today.  RX for Zofran ODT given for as needed nausea.  She is to stay on Imipramine at bedtime for migraine prevention with as needed use of Maxalt.  If she wakes up tomorrow with severe HA, I will put her on a 5 day Prednisone taper.   Her updated medication list for this problem includes:  Maxalt 10 Mg Tabs (Rizatriptan benzoate) ..... Use as needed for migraines  Orders: Ketorolac-Toradol 15mg  (E4540) Admin of Therapeutic Inj  intramuscular or subcutaneous (98119)  Complete Medication List: 1)  Temazepam 30 Mg Caps (Temazepam) .... Take 1 tablet by mouth once a day as needed sleep 2)  Allegra 180 Mg Tabs (Fexofenadine hcl) .... Take 1 tablet by mouth once a day 3)  Imipramine Hcl 50 Mg Tabs (Imipramine hcl) .... Take 1 tablet by mouth once a day at bedtime 4)  Pristiq 50 Mg Xr24h-tab (Desvenlafaxine succinate) .Marland Kitchen.. 1 by mouth daily 5)  Maxalt 10 Mg Tabs (Rizatriptan benzoate) .... Use as needed for migraines 6)  Zofran Odt 8 Mg Tbdp (Ondansetron) .Marland Kitchen.. 1 tab by mouth q 8 hrs as needed nausea  Patient Instructions: 1)  Toradol  injection today for acute migraine. 2)  Go home and rest.  3)  Use dissolvable zofran as needed for nausea. 4)  If HA recurs, you can take a Maxalt. 5)  Call me in the morning if you wake up with a bad HA.  Prescriptions: ZOFRAN ODT 8 MG TBDP (ONDANSETRON) 1 tab by mouth q 8 hrs as needed nausea  #24 x 1   Entered and Authorized by:   Seymour Bars DO   Signed by:   Seymour Bars DO on 06/17/2009   Method used:   Electronically to        [facility] [phone]* (retail)       [address]       [place], [place]  [address]       Ph: [phone]       Fax: [phone]   RxID:   [id number]    Medication Administration  Injection # 1:    Medication: Ketorolac-Toradol 15mg     Diagnosis: MIGRAINE HEADACHE (ICD-346.90)    Route: IM    Site: LUOQ gluteus    Exp Date: 12/02/2010    Lot #: [id number]    Comments: 60mg     Patient tolerated injection without complications    Given by: Jessica Alexander CMA (June 17, 2009 4:06 PM)  Orders Added: 1)  Est. Patient Level III [34742] 2)  Ketorolac-Toradol 15mg  [J1885] 3)  Admin of Therapeutic Inj  intramuscular or subcutaneous [59563]

## 2010-06-04 NOTE — Letter (Signed)
Summary: Out of Work  [facility]  [address], [address]   [place], [place] [address]   Phone: [phone]  Fax: [phone]    June 17, 2009   Employee:  KRYSTAN NORTHROP    To Whom It May Concern:   For Medical reasons, please excuse the above named employee from work for the following dates:  Start:   Feb 14th - 15th  End:   Feb 16th  If you need additional information, please feel free to contact our office.         Sincerely,    Seymour Bars DO

## 2010-06-04 NOTE — Consult Note (Signed)
Summary: [facility]  [facility]   Imported By: Lanelle Bal 05/27/2010 09:58:20  _____________________________________________________________________  External Attachment:    Type:   Image     Comment:   External Document

## 2010-06-04 NOTE — Letter (Signed)
Summary: Work Excuse  [facility]  [address] [place] [address], [address]   [place], [place] [address]   Phone: [phone]  Fax: [phone]    Today's Date: May 09, 2009  Name of Patient: Jessica Alexander  The above named patient had a medical visit today  on [id number]  Please take this into consideration when reviewing the time away from work/school.    Special Instructions:  [  ] None  [  ] To be off the remainder of today, returning to the normal work / school schedule tomorrow.  [  ] To be off until the next scheduled appointment on ______________________.  [  ] Other ________________________________________________________________ ________________________________________________________________________   Sincerely yours,   Nani Gasser MD

## 2010-06-04 NOTE — Letter (Signed)
Summary: Out of Work  [facility]  [address]   [place], [place] [address]   Phone: [phone]  Fax: [phone]    February 24, 2010   Employee:  ISHI DANSER    To Whom It May Concern:   For Medical reasons, please excuse the above named employee from work this afternoon.    If you need additional information, please feel free to contact our office.         Sincerely,    Donna Christen MD

## 2010-06-04 NOTE — Progress Notes (Signed)
Summary: need meds called in  Phone Note Call from Patient   Caller: Patient Summary of Call: Dr.Kirtan Sada     Call Back   [phone]  Patient said she has been Vomiting and having Diarrhea since yesterday morning--5 am.   Been out of work and want to know if something could be called in.      [facility].    Patient asked could she get a call back if this could or couldn't be done. Initial call taken by: Vanessa Swaziland,  May 16, 2009 9:07 AM  Follow-up for Phone Call        Can send in phenergan. Does she want tabs or suppositories?  Follow-up by: Nani Gasser MD,  May 16, 2009 10:45 AM  Additional Follow-up for Phone Call Additional follow up Details #1::        patient said she want the pills Additional Follow-up by: Vanessa Swaziland,  May 16, 2009 11:45 AM    New/Updated Medications: PROMETHAZINE HCL 25 MG TABS (PROMETHAZINE HCL) one by mouth every 6 hours as needed nausea/vomiting. Prescriptions: PROMETHAZINE HCL 25 MG TABS (PROMETHAZINE HCL) one by mouth every 6 hours as needed nausea/vomiting.  #6 x 0   Entered and Authorized by:   Nani Gasser MD   Signed by:   Nani Gasser MD on 05/16/2009   Method used:   Electronically to        [facility] [phone]* (retail)       [address]       [place], [place]  [address]       Ph: [phone]       Fax: [phone]   RxID:   [id number]

## 2010-06-04 NOTE — Consult Note (Signed)
Summary: [facility]  [facility]   Imported By: Lanelle Bal 08/06/2009 14:05:25  _____________________________________________________________________  External Attachment:    Type:   Image     Comment:   External Document

## 2010-06-04 NOTE — Progress Notes (Signed)
Summary: work note  Phone Note Call from Patient   Caller: Patient Call For: Nani Gasser MD Summary of Call: Pt called and states she was not feeling well and was going to call into work and wants to be written out for today Initial call taken by: Avon Gully CMA, Duncan Dull),  January 14, 2010 1:51 PM  Follow-up for Phone Call        taht is fine. OK for note for today.  Follow-up by: Nani Gasser MD,  January 14, 2010 2:17 PM  Additional Follow-up for Phone Call Additional follow up Details #1::        note printed and left up front.left message for pt Additional Follow-up by: Avon Gully CMA, Duncan Dull),  January 14, 2010 4:11 PM

## 2010-06-04 NOTE — Progress Notes (Signed)
  Phone Note Outgoing Call   Call placed by: Clemens Catholic LPN,  May 29, 2010 8:30 AM Call placed to: Patient Summary of Call: call back: left message urine culture positive, complete ABT and call back if any questions or concerns. Initial call taken by: Clemens Catholic LPN,  May 29, 2010 8:31 AM

## 2010-06-04 NOTE — Assessment & Plan Note (Signed)
Summary: acute sinusitis   Vital Signs:  Patient profile:   50 year old female Height:      69 inches Weight:      203 pounds O2 Sat:      100 % on Room air Temp:     98.0 degrees F oral Pulse rate:   80 / minute BP sitting:   117 / 71  (left arm) Cuff size:   regular  Vitals Entered By: Kathlene November (May 09, 2009 2:04 PM)  O2 Flow:  Room air CC: head and sinus congestion, nasal drainage, chest congestion, H/A, pressure, ears popping, hurts to touch head. Started before Christmas   Primary Care Provider:  Nani Gasser MD  CC:  head and sinus congestion, nasal drainage, chest congestion, H/A, pressure, ears popping, and hurts to touch head. Started before Christmas.  History of Present Illness: head and sinus congestion, nasal drainage, chest congestion, H/A, pressure, ears popping, hurts to touch head. Started 12/26. Started with a sever cough so used nyquail and cough medicine.  Hurts to brush her hair and put on her glasses. EArs are constantly popping. Yellow discharge form the nose.  Advil cold and sinus. Using nose drops.  No fever. + chills. No GI sxs.  No wheezing or SOB.   Current Medications (verified): 1)  Temazepam 30 Mg Caps (Temazepam) .... Take 1 Tablet By Mouth Once A Day As Needed Sleep 2)  Allegra 180 Mg Tabs (Fexofenadine Hcl) .... Take 1 Tablet By Mouth Once A Day 3)  Imipramine Hcl 50 Mg Tabs (Imipramine Hcl) .... Take 1 Tablet By Mouth Once A Day At Bedtime 4)  Pristiq 50 Mg Xr24h-Tab (Desvenlafaxine Succinate) .Marland Kitchen.. 1 By Mouth Daily 5)  Maxalt 10 Mg Tabs (Rizatriptan Benzoate) .... Use As Needed For Migraines  Allergies (verified): 1)  ! Biaxin 2)  ! Imitrex  Comments:  Nurse/Medical Assistant: The patient's medications and allergies were reviewed with the patient and were updated in the Medication and Allergy Lists. Kathlene November (May 09, 2009 2:06 PM)  Physical Exam  General:  Well-developed,well-nourished,in no acute distress;  alert,appropriate and cooperative throughout examination Head:  Normocephalic and atraumatic without obvious abnormalities. No apparent alopecia or balding. Eyes:  No corneal or conjunctival inflammation noted. EOMI. Perrla.  Ears:  External ear exam shows no significant lesions or deformities.  Otoscopic examination reveals clear canals, tympanic membranes are intact bilaterally without bulging, retraction, inflammation or discharge. Hearing is grossly normal bilaterally. Nose:  External nasal examination shows no deformity or inflammation. Nasal mucosa are pink and moist without lesions or exudates. Mouth:  Oral mucosa and oropharynx without lesions or exudates.  Teeth in good repair. Op mildy erythematous.  Neck:  No deformities, masses, or tenderness noted. Lungs:  Normal respiratory effort, chest expands symmetrically. Lungs are clear to auscultation, no crackles or wheezes. Heart:  Normal rate and regular rhythm. S1 and S2 normal without gallop, murmur, click, rub or other extra sounds. Pulses:  Radial 2+  Skin:  no rashes.   Cervical Nodes:  No lymphadenopathy noted Psych:  Cognition and judgment appear intact. Alert and cooperative with normal attention span and concentration. No apparent delusions, illusions, hallucinations   Impression & Recommendations:  Problem # 1:  SINUSITIS - ACUTE-NOS (ICD-461.9)  Her updated medication list for this problem includes:    Hydrocodone-homatropine 5-1.5 Mg/1ml Syrp (Hydrocodone-homatropine) .Marland KitchenMarland KitchenMarland KitchenMarland Kitchen 5ml by mouth at up to 3 x a day for cough.    Amoxicillin 500 Mg Cap (Amoxicillin) .Marland Kitchen... Take  1 capsule by mouth three times a day x 10 days  Instructed on treatment. Call if symptoms persist or worsen.   Complete Medication List: 1)  Temazepam 30 Mg Caps (Temazepam) .... Take 1 tablet by mouth once a day as needed sleep 2)  Allegra 180 Mg Tabs (Fexofenadine hcl) .... Take 1 tablet by mouth once a day 3)  Imipramine Hcl 50 Mg Tabs (Imipramine hcl)  .... Take 1 tablet by mouth once a day at bedtime 4)  Pristiq 50 Mg Xr24h-tab (Desvenlafaxine succinate) .Marland Kitchen.. 1 by mouth daily 5)  Maxalt 10 Mg Tabs (Rizatriptan benzoate) .... Use as needed for migraines 6)  Hydrocodone-homatropine 5-1.5 Mg/12ml Syrp (Hydrocodone-homatropine) .... 5ml by mouth at up to 3 x a day for cough. 7)  Amoxicillin 500 Mg Cap (Amoxicillin) .... Take 1 capsule by mouth three times a day x 10 days Prescriptions: HYDROCODONE-HOMATROPINE 5-1.5 MG/5ML SYRP (HYDROCODONE-HOMATROPINE) 5ml by mouth at up to 3 x a day for cough.  #191ml x 0   Entered and Authorized by:   Nani Gasser MD   Signed by:   Nani Gasser MD on 05/09/2009   Method used:   Printed then faxed to ...       [facility] [phone]* (retail)       [address] [place], [place]  [address]       Ph: [phone]       Fax: [phone]   RxID:   [id number] AMOXICILLIN 500 MG CAP (AMOXICILLIN) Take 1 capsule by mouth three times a day X 10 days  #30 x 0   Entered and Authorized by:   Nani Gasser MD   Signed by:   Nani Gasser MD on 05/09/2009   Method used:   Electronically to        [facility] [phone]* (retail)       [address]       [place], [place]  [address]       Ph: [phone]       Fax: [phone]   RxID:   [id number]

## 2010-06-04 NOTE — Letter (Signed)
Summary: Out of Work  [facility]  [address]   [place], [place] [address]   Phone: [phone]  Fax: [phone]    January 14, 2010   Employee:  Jessica Alexander    To Whom It May Concern:   For Medical reasons, please excuse the above named employee from work for the following dates:  Start:   01/14/10  End:   01/14/10  If you need additional information, please feel free to contact our office.         Sincerely,    Nani Gasser, MD

## 2010-06-04 NOTE — Assessment & Plan Note (Signed)
Summary: Left foot pain   Vital Signs:  Patient profile:   50 year old female Height:      69 inches Weight:      199 pounds Pulse rate:   74 / minute BP sitting:   118 / 77  (right arm) Cuff size:   regular  Vitals Entered By: Avon Gully CMA, Duncan Dull) (April 29, 2010 9:43 AM) CC: ankle swollen and painful, used asce wrap but hasnt been able to stay off feet    Primary Care Breaker Springer:  Nani Gasser MD  CC:  ankle swollen and painful and used asce wrap but hasnt been able to stay off feet .  History of Present Illness: ankle swollen and painful, used asce wrap but hasnt been able to stay off feet. Bottoms of left foot is still so painful that she is having a hard time walking. Now feels a know and swelling on the bottom of the arch. Also sometimes her ankle and "whole foot "will swell. Yesterday was so painful was hardly able to walk. Ketp it elevated and wrapped with no relief.   Current Medications (verified): 1)  Temazepam 30 Mg Caps (Temazepam) .... Take 1 Tablet By Mouth Once A Day As Needed Sleep 2)  Allegra 180 Mg Tabs (Fexofenadine Hcl) .... Take 1 Tablet By Mouth Once A Day 3)  Maxalt 10 Mg Tabs (Rizatriptan Benzoate) .... Use As Needed For Migraines 4)  Phenergan 25mg /0.60ml Gel .... Apply 0.4ml  To Wrist Every 4-6 Hours As Needed Nausea  Allergies (verified): 1)  ! Biaxin 2)  ! Imitrex  Comments:  Nurse/Medical Assistant: The patient's medications and allergies were reviewed with the patient and were updated in the Medication and Allergy Lists. Avon Gully CMA, Duncan Dull) (April 29, 2010 9:44 AM)  Physical Exam  General:  Well-developed,well-nourished,in no acute distress; alert,appropriate and cooperative throughout examination Msk:  ON the bottom of her foot I feel about a 1 cm nodule along the base of hte arch that is extremely tender. Some mild swelling around the area but no erythema. Mild swelling over the lateral malleolus.    Impression  & Recommendations:  Problem # 1:  FOOT PAIN, LEFT (ICD-729.5) Before this was mostly her ankle but now has a lesion on the bottom of her foot that I think is the true source of her pain. Will refer to podiatry. Consider cyst on the tendon.  May be amenable to injection. Really her pain is severe. Recommend stay out of work and stay off her foot until can get in with podiatry for further tx.  Cna use Motrin or Tylenol for pain. Can stop wrapping the area.  Orders: Podiatry Referral (Podiatry)  Complete Medication List: 1)  Temazepam 30 Mg Caps (Temazepam) .... Take 1 tablet by mouth once a day as needed sleep 2)  Allegra 180 Mg Tabs (Fexofenadine hcl) .... Take 1 tablet by mouth once a day 3)  Maxalt 10 Mg Tabs (Rizatriptan benzoate) .... Use as needed for migraines 4)  Phenergan 25mg /0.43ml Gel  .... Apply 0.32ml  to wrist every 4-6 hours as needed nausea  Patient Instructions: 1)  We wil call you with your podiatry referral.    Orders Added: 1)  Podiatry Referral [Podiatry] 2)  Est. Patient Level III [[address]]

## 2010-06-04 NOTE — Assessment & Plan Note (Signed)
Summary: Upper back pain, acute   Vital Signs:  Patient profile:   50 year old female Height:      69 inches Weight:      201 pounds Pulse rate:   65 / minute BP sitting:   115 / 72  (left arm) Cuff size:   regular  Vitals Entered By: Kathlene November (June 30, 2009 2:23 PM) CC: upper back pain- caught mom falling yesterday and has been hurting ever since   Primary Care Provider:  Nani Gasser MD  CC:  upper back pain- caught mom falling yesterday and has been hurting ever since.  History of Present Illness: upper back pain- caught mom falling yesterday and has been hurting ever since.  Pain is mostly inbetween her shoulder blades, raditaint up into her neck. Taking Advil 4 tabs every 4-6 hours.  ON heating pad all night long.  Injury occurred yesterday. Almost feels worse today. No old injuries. Hurts to sit. feels better standing.  Hurst to raise arms abover her shoulders.   Current Medications (verified): 1)  Temazepam 30 Mg Caps (Temazepam) .... Take 1 Tablet By Mouth Once A Day As Needed Sleep 2)  Allegra 180 Mg Tabs (Fexofenadine Hcl) .... Take 1 Tablet By Mouth Once A Day 3)  Imipramine Hcl 50 Mg Tabs (Imipramine Hcl) .... Take 1 Tablet By Mouth Once A Day At Bedtime 4)  Maxalt 10 Mg Tabs (Rizatriptan Benzoate) .... Use As Needed For Migraines 5)  Zofran Odt 8 Mg Tbdp (Ondansetron) .Marland Kitchen.. 1 Tab By Mouth Q 8 Hrs As Needed Nausea  Allergies (verified): 1)  ! Biaxin 2)  ! Imitrex  Comments:  Nurse/Medical Assistant: The patient's medications and allergies were reviewed with the patient and were updated in the Medication and Allergy Lists. Kathlene November (June 30, 2009 2:24 PM)  Physical Exam  General:  Well-developed,well-nourished,in no acute distress; alert,appropriate and cooperative throughout examination Head:  Normocephalic and atraumatic without obvious abnormalities. No apparent alopecia or balding. Msk:  Flexion of low back. to about 90 degress,  and  extension normal.  Normal side bending.  Decreased rotation to the left compared the right. Decreased neck flexion secondary to pain.  Normal extension, rotation of the neck and side bending.  Tender over mid thoracic spine and especially the left paraspinous muscles over the mid thoracid spine. Traps are very tight and midly tender. Shoulders with NROM but painful to lift completley above her head.  Pulses:  Radial 2+    Impression & Recommendations:  Problem # 1:  BACK PAIN, UPPER (ICD-724.5) Assessment New Most likley MSK strain thought she was tender over the spine so if not getting better by the end fo the week then will get xray to further eval to rule out fracture. Continue NSAID. Reviewed appropriate dose as she is taking too much.  Add muscle relaxer and hydrocodone at bedtime for acute relief. Given H.O. on upper back stretches. Call if not better by end of week but expect it to take several weeks to get completely better.   Her updated medication list for this problem includes:    Flexeril 10 Mg Tab (Cyclobenzaprine hcl) .Marland Kitchen... Take 1 tablet by mouth at bedtime    Hydrocodone-acetaminophen 5-500 Mg Tabs (Hydrocodone-acetaminophen) .Marland Kitchen... 1-2 tabs by mouth every 6 hours as needed for severe pain.  Complete Medication List: 1)  Temazepam 30 Mg Caps (Temazepam) .... Take 1 tablet by mouth once a day as needed sleep 2)  Allegra 180 Mg Tabs (Fexofenadine hcl) .Marland KitchenMarland KitchenMarland Kitchen  Take 1 tablet by mouth once a day 3)  Imipramine Hcl 50 Mg Tabs (Imipramine hcl) .... Take 1 tablet by mouth once a day at bedtime 4)  Maxalt 10 Mg Tabs (Rizatriptan benzoate) .... Use as needed for migraines 5)  Zofran Odt 8 Mg Tbdp (Ondansetron) .Marland Kitchen.. 1 tab by mouth q 8 hrs as needed nausea 6)  Flexeril 10 Mg Tab (Cyclobenzaprine hcl) .... Take 1 tablet by mouth at bedtime 7)  Hydrocodone-acetaminophen 5-500 Mg Tabs (Hydrocodone-acetaminophen) .Marland Kitchen.. 1-2 tabs by mouth every 6 hours as needed for severe pain.  Patient  Instructions: 1)  ADvill 4 tabs up to 3 x a day with food 2)  Don't use heaiting pad for more than 20 minutes at one time. 3)  Stretches at least 2-3 x a day.  4)  Avoid sitting for too long a period.  5)  Can use the flexeril (muscle relaxer) once get home. Can use the hydrocodone at bedtime.   6)  Call if not making progress over the next week.  7)  Expect several weeks to get better.  Prescriptions: HYDROCODONE-ACETAMINOPHEN 5-500 MG TABS (HYDROCODONE-ACETAMINOPHEN) 1-2 tabs by mouth every 6 hours as needed for severe pain.  #15 x 0   Entered and Authorized by:   Nani Gasser MD   Signed by:   Nani Gasser MD on 06/30/2009   Method used:   Printed then faxed to ...       [facility] [phone]* (retail)       [address] [place], [place]  [address]       Ph: [phone]       Fax: [phone]   RxID:   [id number] FLEXERIL 10 MG TAB (CYCLOBENZAPRINE HCL) Take 1 tablet by mouth at bedtime  #30 x 0   Entered and Authorized by:   Nani Gasser MD   Signed by:   Nani Gasser MD on 06/30/2009   Method used:   Electronically to        [facility] [phone]* (retail)       [address]       [place], [place]  [address]       Ph: [phone]       Fax: [phone]   RxID:   [id number]

## 2010-06-04 NOTE — Assessment & Plan Note (Signed)
Summary: POSSIBEL URI/COUGH/CONGESTION Room 5   Vital Signs:  Patient Profile:   50 Years Old Female CC:      Congestion, sinus problems, cough yellow-green x8 days Height:     69 inches Weight:      198 pounds O2 Sat:      99 % O2 treatment:    Room Air Temp:     98.4 degrees F oral Pulse rate:   83 / minute Pulse rhythm:   regular Resp:     18 per minute BP sitting:   127 / 82  (left arm) Cuff size:   regular  Vitals Entered By: Emilio Math (February 24, 2010 2:51 PM)                  Current Allergies (reviewed today): ! BIAXIN ! IMITREXHistory of Present Illness Chief Complaint: Congestion, sinus problems, cough yellow-green x8 days History of Present Illness:  Subjective: Patient complains of URI symptoms that started 8 days ago.  Developed a cough about 4 days ago.  Cough is worse at night and awakens her. Initial sore throat resolved No pleuritic pain No wheezing + mild nasal congestion + post-nasal drainage No sinus pain/pressure No itchy/red eyes No earache No hemoptysis No SOB No fever, + chills No nausea No vomiting No abdominal pain No diarrhea No skin rashes + fatigue No myalgias No headache Used OTC meds without relief   Current Meds TEMAZEPAM 30 MG CAPS (TEMAZEPAM) Take 1 tablet by mouth once a day as needed sleep ALLEGRA 180 MG TABS (FEXOFENADINE HCL) Take 1 tablet by mouth once a day MAXALT 10 MG TABS (RIZATRIPTAN BENZOATE) use as needed for migraines * PHENERGAN 25MG /0.25ML GEL Apply 0.12ml  to wrist every 4-6 hours as needed nausea BENZONATATE 200 MG CAPS (BENZONATATE) One by mouth hs as needed cough CEFDINIR 300 MG CAPS (CEFDINIR) 1 by mouth q12hr (Rx void after 03/03/10)  REVIEW OF SYSTEMS Constitutional Symptoms      Denies fever, chills, night sweats, weight loss, weight gain, and fatigue.  Eyes       Denies change in vision, eye pain, eye discharge, glasses, contact lenses, and eye surgery. Ear/Nose/Throat/Mouth  Complains of sinus problems and hoarseness.      Denies hearing loss/aids, change in hearing, ear pain, ear discharge, dizziness, frequent runny nose, frequent nose bleeds, sore throat, and tooth pain or bleeding.  Respiratory       Complains of productive cough.      Denies dry cough, wheezing, shortness of breath, asthma, bronchitis, and emphysema/COPD.  Cardiovascular       Denies murmurs, chest pain, and tires easily with exhertion.    Gastrointestinal       Denies stomach pain, nausea/vomiting, diarrhea, constipation, blood in bowel movements, and indigestion. Genitourniary       Denies painful urination, kidney stones, and loss of urinary control. Neurological       Denies paralysis, seizures, and fainting/blackouts. Musculoskeletal       Denies muscle pain, joint pain, joint stiffness, decreased range of motion, redness, swelling, muscle weakness, and gout.  Skin       Denies bruising, unusual mles/lumps or sores, and hair/skin or nail changes.  Psych       Denies mood changes, temper/anger issues, anxiety/stress, speech problems, depression, and sleep problems.  Past History:  Past Medical History: Reviewed history from 08/26/2008 and no changes required. 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 but [facility] has now retired.   Past Surgical History: Reviewed history from 08/26/2008 and no changes required. Hysterectomy  01/1997  Family History: Reviewed history from 08/26/2008 and no changes required. Uncle with MI  Social History: Reviewed history from 02/25/2009 and no changes required. Working at [employer] for [employer] at [employer]. Previously worked at [employer]. BS in Business Admin.  Married to [place] with 2 adult kids.   Never Smoked Drug use-no Regular exercise-yes, walking.  Alcohol use-no   Objective:    Appearance:  Patient appears healthy, stated age, and in no acute distress  Eyes:  Pupils are equal, round, and reactive to light and accomdation.  Extraocular movement is intact.  Conjunctivae are not inflamed.  Ears:  Canals normal.  Tympanic membranes normal.   Nose:  Normal septum.  Normal turbinates, mildly congested.  No sinus tenderness present.  Pharynx:  Normal  Neck:  Supple.  No adenopathy is present.  No thyromegaly is present  Lungs:  Clear to auscultation.  Breath sounds are equal.  Heart:  Regular rate and rhythm without murmurs, rubs, or gallops.  Abdomen:  Nontender without masses or hepatosplenomegaly.  Bowel sounds are present.  No CVA or flank tenderness.  Assessment New Problems: VIRAL URI (ICD-465.9)   Plan New Medications/Changes: CEFDINIR 300 MG CAPS (CEFDINIR) 1 by mouth q12hr (Rx void after 03/03/10)  #20 x 0, 02/24/2010, Donna Christen MD BENZONATATE 200 MG CAPS (BENZONATATE) One by mouth hs as needed cough  #12 x 0, 02/24/2010, Donna Christen MD  New Orders: Est. Patient Level IV [16109] Planning Comments:   Treat symptomatically for now:  Increase fluid intake, begin expectorant/decongestant, cough suppressant.  If fever/chills/sweats persist, or if not improving 5 to 7 days begin Omnicef (given Rx to hold).  Followup with PCP if not improving 7 to 10 days.   The patient and/or caregiver has been counseled thoroughly with regard to medications prescribed including dosage, schedule, interactions, rationale for use, and possible side effects and they verbalize understanding.  Diagnoses and expected course of recovery discussed and will return if not improved as expected or if the condition worsens. Patient and/or caregiver verbalized understanding.  Prescriptions: CEFDINIR 300 MG CAPS (CEFDINIR) 1 by mouth q12hr (Rx void after 03/03/10)  #20 x 0   Entered and Authorized by:   Donna Christen MD   Signed by:   Donna Christen MD on 02/24/2010   Method used:   Print  then Give to Patient   RxID:   [id number] BENZONATATE 200 MG CAPS (BENZONATATE) One by mouth hs as needed cough  #12 x 0   Entered and Authorized by:   Donna Christen MD   Signed by:   Donna Christen MD on 02/24/2010   Method used:   Print then Give to Patient   RxID:   [id number]   Patient Instructions: 1)  May use Mucinex D (guaifenesin with decongestant) twice daily for congestion. 2)  Increase fluid intake, rest. 3)  May use Afrin nasal spray (or generic oxymetazoline) twice daily for about 5 days.  Also recommend using saline nasal spray several times daily and/or saline nasal irrigation. 4)  Begin Cefdinir (Omnicef) if not improving 5 days or if fever develops 5)  Followup with family doctor if not improving 7 to 10 days.  Orders Added: 1)  Est. Patient Level IV [29528]

## 2010-06-04 NOTE — Assessment & Plan Note (Signed)
Summary: Acute migraine   Vital Signs:  Patient profile:   50 year old female Height:      69 inches Weight:      203 pounds Temp:     97.9 degrees F oral Pulse rate:   82 / minute BP sitting:   116 / 69  (left arm) Cuff size:   regular  Vitals Entered By: Kathlene November (July 16, 2009 10:14 AM) CC: migraine with vomiting since Monday- can not keep anything down, chills, Headache   Primary Care Provider:  Nani Gasser MD  CC:  migraine with vomiting since Monday- can not keep anything down, chills, and Headache.  History of Present Illness: migraine with vomiting since Monday- can not keep anything down, chills.  No known triggers. Tried maxalt. Also taking odansatron. Pain is 12/10. Not taken anything today.  No diarrhea. + Lightheadedness.  having one severe HA a month. Sometimes 4-5 x amonth has alow grade one. Using at least 6 maxalt a month. Noramlly sleeps well. Very stressed out.    Was here a month ago for an acute migraine and saw Dr. Cathey Endow.   Current Medications (verified): 1)  Temazepam 30 Mg Caps (Temazepam) .... Take 1 Tablet By Mouth Once A Day As Needed Sleep 2)  Allegra 180 Mg Tabs (Fexofenadine Hcl) .... Take 1 Tablet By Mouth Once A Day 3)  Imipramine Hcl 50 Mg Tabs (Imipramine Hcl) .... Take 1 Tablet By Mouth Once A Day At Bedtime 4)  Maxalt 10 Mg Tabs (Rizatriptan Benzoate) .... Use As Needed For Migraines 5)  Zofran Odt 8 Mg Tbdp (Ondansetron) .Marland Kitchen.. 1 Tab By Mouth Q 8 Hrs As Needed Nausea 6)  Hydrocodone-Acetaminophen 5-500 Mg Tabs (Hydrocodone-Acetaminophen) .Marland Kitchen.. 1-2 Tabs By Mouth Every 6 Hours As Needed For Severe Pain.  Allergies (verified): 1)  ! Biaxin 2)  ! Imitrex  Comments:  Nurse/Medical Assistant: The patient's medications and allergies were reviewed with the patient and were updated in the Medication and Allergy Lists. Kathlene November (July 16, 2009 10:15 AM)  Physical Exam  General:  Well-developed,well-nourished,in no acute distress;  alert,appropriate and cooperative throughout examination. Appears fatigued. Squinting her eyes.  Head:  Normocephalic and atraumatic without obvious abnormalities. No apparent alopecia or balding. Eyes:  No corneal or conjunctival inflammation noted. EOMI. Perrla.  Lungs:  Normal respiratory effort, chest expands symmetrically. Lungs are clear to auscultation, no crackles or wheezes. Heart:  Normal rate and regular rhythm. S1 and S2 normal without gallop, murmur, click, rub or other extra sounds. Neurologic:  alert & oriented X3, cranial nerves II-XII intact, gait normal, and DTRs symmetrical and normal.   Skin:  no rashes.   Psych:  Cognition and judgment appear intact. Alert and cooperative with normal attention span and concentration. No apparent delusions, illusions, hallucinations   Impression & Recommendations:  Problem # 1:  MIGRAINE HEADACHE (ICD-346.90)  I think she would be a great candiate for prophylactiv therapy, something other than the imipramine. She would like to see Dr. Mickie Kay in [place] (neuro) that her mother sees. We can do this here but she would like a consult wtih him.  For acute migraine treat with toradol 60mg a nd phenergan 25mg  IM. Also will send rx to [facility] for topical phenergan since she gets very nauseated with her migraines. IN addition will go ahead and give Dep medrol 60mg IM today and start steroid taper tomorrow.  Her updated medication list for this problem includes:    Maxalt 10 Mg Tabs (Rizatriptan benzoate) .Marland KitchenMarland KitchenMarland KitchenMarland Kitchen  Use as needed for migraines    Hydrocodone-acetaminophen 5-500 Mg Tabs (Hydrocodone-acetaminophen) .Marland Kitchen... 1-2 tabs by mouth every 6 hours as needed for severe pain.  Orders: Neurology Referral (Neuro) Ketorolac-Toradol 15mg  (Q4696) Promethazine up to 50mg  (J2550) Admin of Therapeutic Inj  intramuscular or subcutaneous (29528) Depo- Medrol 80mg  (J1040)  Complete Medication List: 1)  Temazepam 30 Mg Caps (Temazepam) .... Take 1 tablet by  mouth once a day as needed sleep 2)  Allegra 180 Mg Tabs (Fexofenadine hcl) .... Take 1 tablet by mouth once a day 3)  Imipramine Hcl 50 Mg Tabs (Imipramine hcl) .... Take 1 tablet by mouth once a day at bedtime 4)  Maxalt 10 Mg Tabs (Rizatriptan benzoate) .... Use as needed for migraines 5)  Zofran Odt 8 Mg Tbdp (Ondansetron) .Marland Kitchen.. 1 tab by mouth q 8 hrs as needed nausea 6)  Hydrocodone-acetaminophen 5-500 Mg Tabs (Hydrocodone-acetaminophen) .Marland Kitchen.. 1-2 tabs by mouth every 6 hours as needed for severe pain. 7)  Prednisone 10 Mg Tabs (Prednisone) .... 6 tabs on day 1, 4 tabs on day 2, 2 tabs on day 3, 1 tab on day 4, the stop. 8)  Phenergan 25mg /0.31ml Gel  .... Apply 0.63ml  to wrist every 4-6 hours as needed nausea Prescriptions: PHENERGAN 25MG /0.25ML GEL Apply 0.30ml  to wrist every 4-6 hours as needed nausea  #25ml x 2   Entered and Authorized by:   Nani Gasser MD   Signed by:   Nani Gasser MD on 07/16/2009   Method used:   Printed then faxed to ...       [facility] [phone]* (retail)       [address] [place], [place]  [address]       Ph: [phone]       Fax: [phone]   RxID:   [id number] PREDNISONE 10 MG TABS (PREDNISONE) 6 tabs on Day 1, 4 tabs on Day 2, 2 tabs on Day 3, 1 tab on Day 4, the stop.  #13 x 0   Entered and Authorized by:   Nani Gasser MD   Signed by:   Nani Gasser MD on 07/16/2009   Method used:   Electronically to        [facility] [phone]* (retail)       [address] [place], [place]  [address]       Ph: [phone]       Fax: [phone]   RxID:   [id number]    Medication Administration  Injection # 1:    Medication: Ketorolac-Toradol 15mg     Diagnosis: MIGRAINE HEADACHE (ICD-346.90)    Route: IM    Site: LUOQ gluteus    Exp Date: 12/02/2010    Lot #: [id number]    Mfr: [email or web address]    Comments: Toradol 60mg  given IM    Patient tolerated injection without complications    Given by: Kathlene November (July 16, 2009 10:54 AM)  Injection # 2:    Medication: Promethazine up to 50mg     Diagnosis: MIGRAINE HEADACHE (ICD-346.90)    Route: IM    Site: LUOQ gluteus    Exp Date: 01/01/2010    Lot #: [id number]    Mfr: [email or web address]    Patient tolerated injection without complications    Given by: Kathlene November (July 16, 2009 10:55 AM)  Injection # 3:    Medication: Depo- Medrol 80mg     Diagnosis: MIGRAINE HEADACHE (ICD-346.90)  Route: IM    Site: RUOQ gluteus    Exp Date: 03/03/2010    Lot #: [id number]    Mfr: Pharmacia    Patient tolerated injection without complications    Given by: Kathlene November (July 16, 2009 10:56 AM)  Orders Added: 1)  Neurology Referral [Neuro] 2)  Ketorolac-Toradol 15mg  [J1885] 3)  Promethazine up to 50mg  [J2550] 4)  Admin of Therapeutic Inj  intramuscular or subcutaneous [96372] 5)  Depo- Medrol 80mg  [J1040] 6)  Est. Patient Level IV [60454]

## 2010-06-10 ENCOUNTER — Encounter: Payer: Self-pay | Admitting: Emergency Medicine

## 2010-06-18 NOTE — Progress Notes (Signed)
Summary: Prescriptions  Office Visit   Imported By: Haskell Riling 06/10/2010 09:26:11  _____________________________________________________________________  External Attachment:    Type:   Image     Comment:   External Document

## 2010-06-18 NOTE — Progress Notes (Signed)
Summary: Office Visit  Office Visit   Imported By: Haskell Riling 06/10/2010 09:25:54  _____________________________________________________________________  External Attachment:    Type:   Image     Comment:   External Document

## 2010-09-22 ENCOUNTER — Other Ambulatory Visit: Payer: Self-pay | Admitting: Family Medicine

## 2010-09-22 DIAGNOSIS — G4720 Circadian rhythm sleep disorder, unspecified type: Secondary | ICD-10-CM

## 2010-09-22 MED ORDER — TEMAZEPAM 30 MG PO CAPS: 30.0000 mg | ORAL_CAPSULE | Freq: Every evening | ORAL | Status: DC | PRN

## 2010-09-22 NOTE — Telephone Encounter (Signed)
Received fax from [facility] for temazepam 30 mg PO QHS PRN.   Plan  Refilled #30/1 refill and faxed to [facility]. Jarvis Newcomer, LPN Domingo Dimes

## 2010-11-18 ENCOUNTER — Other Ambulatory Visit: Payer: Self-pay | Admitting: Family Medicine

## 2010-11-18 NOTE — Telephone Encounter (Signed)
Request made for restoril , but too early for refill.  Rx good til 11-22-10. Jarvis Newcomer, LPN Domingo Dimes

## 2010-11-18 NOTE — Telephone Encounter (Signed)
Refill requested too soon for restoril.  Med good til 11-22-10.  To request later. Jarvis Newcomer, LPN Domingo Dimes

## 2010-11-21 ENCOUNTER — Other Ambulatory Visit: Payer: Self-pay | Admitting: Family Medicine

## 2010-11-21 DIAGNOSIS — G4720 Circadian rhythm sleep disorder, unspecified type: Secondary | ICD-10-CM

## 2010-11-21 MED ORDER — TEMAZEPAM 30 MG PO CAPS: 30.0000 mg | ORAL_CAPSULE | Freq: Every evening | ORAL | Status: DC | PRN

## 2010-11-21 NOTE — Telephone Encounter (Signed)
Rf restoril.  Recent office visit 06-30-10 as seen in centricity.  #30/1 refills then will need follow up for add'l refills.

## 2010-11-23 ENCOUNTER — Other Ambulatory Visit: Payer: Self-pay | Admitting: *Deleted

## 2011-01-22 ENCOUNTER — Other Ambulatory Visit: Payer: Self-pay | Admitting: *Deleted

## 2011-01-22 DIAGNOSIS — G4720 Circadian rhythm sleep disorder, unspecified type: Secondary | ICD-10-CM

## 2011-01-22 MED ORDER — TEMAZEPAM 30 MG PO CAPS: 30.0000 mg | ORAL_CAPSULE | Freq: Every evening | ORAL | Status: DC | PRN

## 2011-03-24 ENCOUNTER — Other Ambulatory Visit: Payer: Self-pay | Admitting: *Deleted

## 2011-03-24 DIAGNOSIS — G4720 Circadian rhythm sleep disorder, unspecified type: Secondary | ICD-10-CM

## 2011-03-24 MED ORDER — TEMAZEPAM 30 MG PO CAPS: 30.0000 mg | ORAL_CAPSULE | Freq: Every evening | ORAL | Status: DC | PRN

## 2011-05-31 ENCOUNTER — Encounter: Payer: Self-pay | Admitting: Family Medicine

## 2011-05-31 ENCOUNTER — Ambulatory Visit (INDEPENDENT_AMBULATORY_CARE_PROVIDER_SITE_OTHER): Payer: BC Managed Care – PPO | Admitting: Family Medicine

## 2011-05-31 VITALS — BP 115/75 | HR 67 | Ht 69.0 in | Wt 185.0 lb

## 2011-05-31 DIAGNOSIS — Z13220 Encounter for screening for lipoid disorders: Secondary | ICD-10-CM

## 2011-05-31 DIAGNOSIS — G4720 Circadian rhythm sleep disorder, unspecified type: Secondary | ICD-10-CM

## 2011-05-31 DIAGNOSIS — Z23 Encounter for immunization: Secondary | ICD-10-CM

## 2011-05-31 MED ORDER — TEMAZEPAM 30 MG PO CAPS: 30.0000 mg | ORAL_CAPSULE | Freq: Every evening | ORAL | Status: DC | PRN

## 2011-05-31 NOTE — Progress Notes (Signed)
  Subjective:    Patient ID: Jessica Alexander, female    DOB: Sep 24, 1960, 51 y.o.   MRN: [id number]  HPI Migraines-overall doing well. She feels they have been better since she's been taking a multivitamin. If she does need to take a medications she is primarily using over-the-counter pain relievers. So far this is working well for her.  insomnia-Has been taking MVI and this has helped her sleep.  Says No caffeine after lunch. Says can stay asleep but has hard time falling asleep. Says will use occasionally.     Review of Systems     Objective:   Physical Exam  Constitutional: She is oriented to person, place, and time. She appears well-developed and well-nourished.  HENT:  Head: Normocephalic and atraumatic.  Cardiovascular: Normal rate, regular rhythm and normal heart sounds.   Pulmonary/Chest: Effort normal and breath sounds normal.  Neurological: She is alert and oriented to person, place, and time. No cranial nerve deficit.  Skin: Skin is warm and dry.  Psychiatric: She has a normal mood and affect. Her behavior is normal.          Assessment & Plan:  Insomnia- Use prn. REfill the tamezepam.  I'M glad she's only using it on a when necessary use we'll discuss the importance of keeping up when necessary to avoid dependency.  Migraines-well controlled.  Flu vaccine given today.  She is due for screening lipids and we discussed this. I will add an order lab slip.  She also admits she is overdue for her Pap smear and I encouraged her to make an appointment, call GYN or with our office to get her up-to-date.

## 2011-05-31 NOTE — Patient Instructions (Signed)
Try to get your pap smear.

## 2011-06-01 NOTE — Progress Notes (Signed)
Addended by: Nani Gasser D on: 06/01/2011 08:56 AM   Modules accepted: Orders

## 2011-07-15 ENCOUNTER — Ambulatory Visit (INDEPENDENT_AMBULATORY_CARE_PROVIDER_SITE_OTHER): Payer: BC Managed Care – PPO | Admitting: Family Medicine

## 2011-07-15 ENCOUNTER — Encounter: Payer: Self-pay | Admitting: Family Medicine

## 2011-07-15 VITALS — BP 122/81 | HR 81 | Temp 98.2°F | Ht 69.0 in | Wt 186.0 lb

## 2011-07-15 DIAGNOSIS — J0100 Acute maxillary sinusitis, unspecified: Secondary | ICD-10-CM

## 2011-07-15 DIAGNOSIS — J209 Acute bronchitis, unspecified: Secondary | ICD-10-CM

## 2011-07-15 DIAGNOSIS — J111 Influenza due to unidentified influenza virus with other respiratory manifestations: Secondary | ICD-10-CM

## 2011-07-15 MED ORDER — HYDROCOD POLST-CHLORPHEN POLST 10-8 MG/5ML PO LQCR: 5.0000 mL | Freq: Two times a day (BID) | ORAL | Status: DC | PRN

## 2011-07-15 MED ORDER — AMOXICILLIN-POT CLAVULANATE 875-125 MG PO TABS: 1.0000 | ORAL_TABLET | Freq: Two times a day (BID) | ORAL | Status: AC

## 2011-07-15 MED ORDER — SODIUM CHLORIDE 0.9 % IV SOLN: 125.0000 mg | Freq: Once | INTRAVENOUS | Status: DC

## 2011-07-15 MED ORDER — IPRATROPIUM BROMIDE 0.02 % IN SOLN: 0.5000 mg | Freq: Once | RESPIRATORY_TRACT | Status: DC

## 2011-07-15 MED ORDER — ALBUTEROL SULFATE HFA 108 (90 BASE) MCG/ACT IN AERS: 2.0000 | INHALATION_SPRAY | RESPIRATORY_TRACT | Status: AC | PRN

## 2011-07-15 MED ORDER — FLUTICASONE PROPIONATE 50 MCG/ACT NA SUSP: 2.0000 | Freq: Every day | NASAL | Status: AC

## 2011-07-15 MED ORDER — FEXOFENADINE-PSEUDOEPHED ER 180-240 MG PO TB24: 1.0000 | ORAL_TABLET | Freq: Every day | ORAL | Status: DC

## 2011-07-15 MED ORDER — ALBUTEROL SULFATE (5 MG/ML) 0.5% IN NEBU: 2.5000 mg | INHALATION_SOLUTION | Freq: Once | RESPIRATORY_TRACT | Status: DC

## 2011-07-15 NOTE — Patient Instructions (Addendum)
Bronchitis Bronchitis is the body's way of reacting to injury and/or infection (inflammation) of the bronchi. Bronchi are the air tubes that extend from the windpipe into the lungs. If the inflammation becomes severe, it may cause shortness of breath. CAUSES  Inflammation may be caused by:  A virus.   Germs (bacteria).   Dust.   Allergens.   Pollutants and many other irritants.  The cells lining the bronchial tree are covered with tiny hairs (cilia). These constantly beat upward, away from the lungs, toward the mouth. This keeps the lungs free of pollutants. When these cells become too irritated and are unable to do their job, mucus begins to develop. This causes the characteristic cough of bronchitis. The cough clears the lungs when the cilia are unable to do their job. Without either of these protective mechanisms, the mucus would settle in the lungs. Then you would develop pneumonia. Smoking is a common cause of bronchitis and can contribute to pneumonia. Stopping this habit is the single most important thing you can do to help yourself. TREATMENT   Your caregiver may prescribe an antibiotic if the cough is caused by bacteria. Also, medicines that open up your airways make it easier to breathe. Your caregiver may also recommend or prescribe an expectorant. It will loosen the mucus to be coughed up. Only take over-the-counter or prescription medicines for pain, discomfort, or fever as directed by your caregiver.   Removing whatever causes the problem (smoking, for example) is critical to preventing the problem from getting worse.   Cough suppressants may be prescribed for relief of cough symptoms.   Inhaled medicines may be prescribed to help with symptoms now and to help prevent problems from returning.   For those with recurrent (chronic) bronchitis, there may be a need for steroid medicines.  SEEK IMMEDIATE MEDICAL CARE IF:   During treatment, you develop more pus-like mucus  (purulent sputum).   You have a fever.   Your baby is older than 3 months with a rectal temperature of 102 F (38.9 C) or higher.   Your baby is 59 months old or younger with a rectal temperature of 100.4 F (38 C) or higher.   You become progressively more ill.   You have increased difficulty breathing, wheezing, or shortness of breath.  It is necessary to seek immediate medical care if you are elderly or sick from any other disease. MAKE SURE YOU:   Understand these instructions.   Will watch your condition.   Will get help right away if you are not doing well or get worse.  Document Released: 04/19/2005 Document Revised: 04/08/2011 Document Reviewed: 02/27/2008 [facility] Patient Information 2012 [place], [place].Asthma, Adult Asthma is caused by narrowing of the air passages in the lungs. It may be triggered by pollen, dust, animal dander, molds, some foods, respiratory infections, exposure to smoke, exercise, emotional stress or other allergens (things that cause allergic reactions or allergies). Repeat attacks are common. HOME CARE INSTRUCTIONS   Use prescription medications as ordered by your caregiver.   Avoid pollen, dust, animal dander, molds, smoke and other things that cause attacks at home and at work.   You may have fewer attacks if you decrease dust in your home. Electrostatic air cleaners may help.   It may help to replace your pillows or mattress with materials less likely to cause allergies.   Talk to your caregiver about an action plan for managing asthma attacks at home, including, the use of a peak flow meter which  measures the severity of your asthma attack. An action plan can help minimize or stop the attack without having to seek medical care.   If you are not on a fluid restriction, drink 8 to 10 glasses of water each day.   Always have a plan prepared for seeking medical attention, including, calling your physician, accessing local emergency care, and  calling 911 (in the [place] for a severe attack.   Discuss possible exercise routines with your caregiver.   If animal dander is the cause of asthma, you may need to get rid of pets.  SEEK MEDICAL CARE IF:   You have wheezing and shortness of breath even if taking medicine to prevent attacks.   You have muscle aches, chest pain or thickening of sputum.   Your sputum changes from clear or white to yellow, green, gray, or bloody.   You have any problems that may be related to the medicine you are taking (such as a rash, itching, swelling or trouble breathing).  SEEK IMMEDIATE MEDICAL CARE IF:   Your usual medicines do not stop your wheezing or there is increased coughing and/or shortness of breath.   You have increased difficulty breathing.   You have a fever.  MAKE SURE YOU:   Understand these instructions.   Will watch your condition.   Will get help right away if you are not doing well or get worse.  Document Released: 04/19/2005 Document Revised: 04/08/2011 Document Reviewed: 12/06/2007 [facility] Patient Information 2012 [place], [place].Sinusitis Sinuses are air pockets within the bones of your face. The growth of bacteria within a sinus leads to infection. The infection prevents the sinuses from draining. This infection is called sinusitis. SYMPTOMS  There will be different areas of pain depending on which sinuses have become infected. The maxillary sinuses often produce pain beneath the eyes.  Frontal sinusitis may cause pain in the middle of the forehead and above the eyes.  Other problems (symptoms) include: Toothaches.  Colored, pus-like (purulent) drainage from the nose.  Swelling, warmth, and tenderness over the sinus areas may be signs of infection.  TREATMENT  Sinusitis is most often determined by an exam.X-rays may be taken. If x-rays have been taken, make sure you obtain your results or find out how you are to obtain them. Your caregiver may give you medications  (antibiotics). These are medications that will help kill the bacteria causing the infection. You may also be given a medication (decongestant) that helps to reduce sinus swelling.  HOME CARE INSTRUCTIONS  Only take over-the-counter or prescription medicines for pain, discomfort, or fever as directed by your caregiver.  Drink extra fluids. Fluids help thin the mucus so your sinuses can drain more easily.  Applying either moist heat or ice packs to the sinus areas may help relieve discomfort.  Use saline nasal sprays to help moisten your sinuses. The sprays can be found at your local drugstore.  SEEK IMMEDIATE MEDICAL CARE IF: You have a fever.  You have increasing pain, severe headaches, or toothache.  You have nausea, vomiting, or drowsiness.  You develop unusual swelling around the face or trouble seeing.  MAKE SURE YOU:  Understand these instructions.  Will watch your condition.  Will get help right away if you are not doing well or get worse.  Document Released: 04/19/2005 Document Revised: 04/08/2011 Document Reviewed: 11/16/2006 [facility] Patient Information 2012 [place], [place].

## 2011-07-15 NOTE — Progress Notes (Signed)
  Subjective:    Patient ID: Jessica Alexander, female    DOB: 27-Jun-1960, 51 y.o.   MRN: [id number]  URI  This is a new problem. The current episode started in the past 7 days (started on Saturday). The problem has been gradually worsening. The maximum temperature recorded prior to her arrival was 102 - 102.9 F. The fever has been present for 3 to 4 days. Associated symptoms include chest pain, congestion, coughing, ear pain, headaches, joint pain, neck pain, a plugged ear sensation, rhinorrhea, sinus pain, sneezing, a sore throat and swollen glands. Pertinent negatives include no abdominal pain, diarrhea, dysuria, joint swelling, nausea, rash, vomiting or wheezing. She has tried NSAIDs and decongestant for the symptoms. The treatment provided no relief.  Cough This is a new problem. The current episode started in the past 7 days. The problem has been gradually worsening. The cough is non-productive. Associated symptoms include chest pain, ear pain, headaches, rhinorrhea and a sore throat. Pertinent negatives include no rash or wheezing. The treatment provided no relief. Her past medical history is significant for pneumonia. There is no history of asthma, bronchiectasis, bronchitis, COPD, emphysema or environmental allergies.      Review of Systems  HENT: Positive for ear pain, congestion, sore throat, rhinorrhea, sneezing and neck pain.   Respiratory: Positive for cough. Negative for wheezing.   Cardiovascular: Positive for chest pain.  Gastrointestinal: Negative for nausea, vomiting, abdominal pain and diarrhea.  Genitourinary: Negative for dysuria.  Musculoskeletal: Positive for joint pain.  Skin: Negative for rash.  Neurological: Positive for headaches.  Hematological: Negative for environmental allergies.  All other systems reviewed and are negative.      BP 122/81  Pulse 81  Temp(Src) 98.2 F (36.8 C) (Oral)  Ht 5\' 9"  (1.753 m)  Wt 186 lb (84.369 kg)  BMI 27.47 kg/m2  SpO2  97% Objective:   Physical Exam  Nursing note and vitals reviewed. Constitutional: She is oriented to person, place, and time. She appears well-developed and well-nourished. No distress.  HENT:  Head: Normocephalic and atraumatic.  Right Ear: Tympanic membrane and ear canal normal.  Left Ear: Hearing normal.  Nose: Nose normal.  Mouth/Throat: Oropharynx is clear and moist. No oropharyngeal exudate.       Tenderness along the L ear canal  Eyes: Right eye exhibits no discharge. Left eye exhibits no discharge. No scleral icterus.  Neck: Neck supple.  Cardiovascular: Normal rate and regular rhythm.   Murmur heard. Pulmonary/Chest: No respiratory distress. She has decreased breath sounds. She has no wheezes. She has rhonchi. She has no rales.  Lymphadenopathy:    She has cervical adenopathy.  Neurological: She is alert and oriented to person, place, and time.  Skin: Skin is warm and dry.      Assessment & Plan:  Bronchitis/sinusitis secondary infection from a flu infection. Placed on Augmentin 875 twice a day, Proventil HFA 2 puffs every 3 hours when necessary, Tussionex 1/2-1 teaspoon by mouth twice a day, Allegra-D once a day, and Flonase 2 puffs each nostril daily. Return for followup when necessary

## 2011-07-16 ENCOUNTER — Ambulatory Visit: Payer: BC Managed Care – PPO | Admitting: Physician Assistant

## 2011-08-02 ENCOUNTER — Other Ambulatory Visit: Payer: Self-pay | Admitting: *Deleted

## 2011-08-02 DIAGNOSIS — G4720 Circadian rhythm sleep disorder, unspecified type: Secondary | ICD-10-CM

## 2011-08-02 MED ORDER — TEMAZEPAM 30 MG PO CAPS: 30.0000 mg | ORAL_CAPSULE | Freq: Every evening | ORAL | Status: DC | PRN

## 2011-10-26 ENCOUNTER — Other Ambulatory Visit: Payer: Self-pay | Admitting: *Deleted

## 2011-10-26 DIAGNOSIS — G4720 Circadian rhythm sleep disorder, unspecified type: Secondary | ICD-10-CM

## 2011-10-26 MED ORDER — TEMAZEPAM 30 MG PO CAPS: 30.0000 mg | ORAL_CAPSULE | Freq: Every evening | ORAL | Status: DC | PRN

## 2011-11-17 ENCOUNTER — Ambulatory Visit (INDEPENDENT_AMBULATORY_CARE_PROVIDER_SITE_OTHER): Payer: BC Managed Care – PPO | Admitting: Family Medicine

## 2011-11-17 ENCOUNTER — Encounter: Payer: Self-pay | Admitting: Family Medicine

## 2011-11-17 VITALS — BP 118/79 | HR 73 | Temp 98.4°F | Ht 69.0 in | Wt 180.0 lb

## 2011-11-17 DIAGNOSIS — N390 Urinary tract infection, site not specified: Secondary | ICD-10-CM

## 2011-11-17 LAB — POCT URINALYSIS DIPSTICK
Bilirubin, UA: NEGATIVE
Blood, UA: NEGATIVE
Glucose, UA: NEGATIVE
Ketones, UA: NEGATIVE
Protein, UA: NEGATIVE
Urobilinogen, UA: 0.2
pH, UA: 7

## 2011-11-17 MED ORDER — KETOROLAC TROMETHAMINE 60 MG/2ML IM SOLN
60.0000 mg | Freq: Once | INTRAMUSCULAR | Status: AC
  Administered 2011-11-17: 60 mg via INTRAMUSCULAR

## 2011-11-17 MED ORDER — CIPROFLOXACIN HCL 500 MG PO TABS: 500.0000 mg | ORAL_TABLET | Freq: Two times a day (BID) | ORAL | Status: DC

## 2011-11-17 NOTE — Progress Notes (Signed)
  Subjective:    Patient ID: Jessica Alexander, female    DOB: [birth year], 51 y.o.   MRN: [id number]  HPI 3 days of dysuria, back pain and abdominal cramping Has been using IBU and Tylenol and heating pad.  It woke her up at 2:30 AM.  No fever.  Had diarrhea about 4 days ago and then traveled to [place] for a reuinin and it was very hot.  Feeling very nauseated.  No vomiting.  No hematuria.  No recentl ABX.  Last UTI was years ago.  Using AZO standard too.    Review of Systems     Objective:   Physical Exam  Constitutional: She is oriented to person, place, and time. She appears well-developed and well-nourished.  HENT:  Head: Normocephalic and atraumatic.  Abdominal: Bowel sounds are normal. She exhibits no distension and no mass. There is no rebound and no guarding.       Mildly tender in the left lower quadrant  Musculoskeletal:       Mild bilat CVA tenderness  Neurological: She is alert and oriented to person, place, and time.  Skin: Skin is warm and dry.  Psychiatric: She has a normal mood and affect. Her behavior is normal.          Assessment & Plan:  UTI - Stay hydrated and will start cipro 500mg  bid x 3 days. Call if not bettter in 3 days.  H.O given. Toradol injection givne for pain. Pt wanted pain meds but I explained we only use IBU/tylenol and AZO for pain relief.

## 2011-11-17 NOTE — Patient Instructions (Signed)

## 2011-11-17 NOTE — Addendum Note (Signed)
Addended by: Wyline Beady on: 11/17/2011 01:18 PM   Modules accepted: Orders

## 2011-11-24 ENCOUNTER — Encounter: Payer: Self-pay | Admitting: Obstetrics & Gynecology

## 2011-11-24 ENCOUNTER — Ambulatory Visit (INDEPENDENT_AMBULATORY_CARE_PROVIDER_SITE_OTHER): Payer: BC Managed Care – PPO | Admitting: Obstetrics & Gynecology

## 2011-11-24 VITALS — BP 122/82 | HR 69 | Temp 98.5°F | Resp 16 | Ht 69.0 in | Wt 180.0 lb

## 2011-11-24 DIAGNOSIS — Z01419 Encounter for gynecological examination (general) (routine) without abnormal findings: Secondary | ICD-10-CM

## 2011-11-24 DIAGNOSIS — Z23 Encounter for immunization: Secondary | ICD-10-CM

## 2011-11-24 MED ORDER — TETANUS-DIPHTH-ACELL PERTUSSIS 5-2.5-18.5 LF-MCG/0.5 IM SUSP: 0.5000 mL | Freq: Once | INTRAMUSCULAR | Status: DC

## 2011-11-24 NOTE — Progress Notes (Signed)
  Subjective:     Jessica Alexander is a 51 y.o. female here for a routine exam.  Current complaints: none.  Personal health questionnaire reviewed: yes.   Gynecologic History No LMP recorded. Patient has had a hysterectomy. Contraception: status post hysterectomy Last Pap: n/a. Results were: all pap smears are nml and s/p hyst Last mammogram: 2009. Results were: normal  Obstetric History OB History    Grav Para Term Preterm Abortions TAB SAB Ect Mult Living   2 2 2       2      # Outc Date GA Lbr Len/2nd Wgt Sex Del Anes PTL Lv   1 TRM      SVD      2 TRM      SVD          The following portions of the patient's history were reviewed and updated as appropriate: allergies, current medications, past family history, past medical history, past social history, past surgical history and problem list.  Review of Systems A comprehensive review of systems was negative.    Objective:    Vitals:  WNL General appearance: alert, cooperative and no distress Head: Normocephalic, without obvious abnormality, atraumatic Eyes: negative Throat: lips, mucosa, and tongue normal; teeth and gums normal Lungs: clear to auscultation bilaterally Breasts: normal appearance, no masses or tenderness, No nipple retraction or dimpling, No nipple discharge or bleeding Heart: regular rate and rhythm Abdomen: soft, non-tender; bowel sounds normal; no masses,  no organomegaly Pelvic: external genitalia normal, right adnexal fulness, no bladder tenderness, perianal skin: no external genital warts noted, rectovaginal septum norma (stoll in vault), urethra without abnormality or discharge, uterus surgically absent, and vagina normal without discharge Extremities: no edema, redness or tenderness in the calves or thighs Skin: no lesions or rash Lymph nodes: Axillary adenopathy: none       Assessment:    Healthy female exam.  Right adnexal fullness   Plan:    Education reviewed: self breast exams, skin  cancer screening and weight bearing exercise. Contraception: status post hysterectomy. Mammogram ordered. Follow up in: will call if [place] is abnml to come in .

## 2011-11-29 ENCOUNTER — Telehealth: Payer: Self-pay | Admitting: *Deleted

## 2011-11-29 DIAGNOSIS — N949 Unspecified condition associated with female genital organs and menstrual cycle: Secondary | ICD-10-CM

## 2011-11-29 NOTE — Telephone Encounter (Signed)
Transvaginal non ob scan added to order.

## 2011-12-01 ENCOUNTER — Other Ambulatory Visit: Payer: BC Managed Care – PPO

## 2011-12-30 ENCOUNTER — Other Ambulatory Visit: Payer: Self-pay | Admitting: *Deleted

## 2011-12-30 DIAGNOSIS — G4720 Circadian rhythm sleep disorder, unspecified type: Secondary | ICD-10-CM

## 2011-12-30 MED ORDER — TEMAZEPAM 30 MG PO CAPS: 30.0000 mg | ORAL_CAPSULE | Freq: Every evening | ORAL | Status: DC | PRN

## 2012-02-11 ENCOUNTER — Ambulatory Visit (INDEPENDENT_AMBULATORY_CARE_PROVIDER_SITE_OTHER): Payer: BC Managed Care – PPO | Admitting: Family Medicine

## 2012-02-11 ENCOUNTER — Encounter: Payer: Self-pay | Admitting: Family Medicine

## 2012-02-11 VITALS — BP 123/83 | HR 66 | Ht 69.0 in | Wt 175.0 lb

## 2012-02-11 DIAGNOSIS — Z23 Encounter for immunization: Secondary | ICD-10-CM

## 2012-02-11 DIAGNOSIS — G43909 Migraine, unspecified, not intractable, without status migrainosus: Secondary | ICD-10-CM

## 2012-02-11 MED ORDER — RIZATRIPTAN BENZOATE 10 MG PO TABS: 10.0000 mg | ORAL_TABLET | ORAL | Status: DC | PRN

## 2012-02-11 MED ORDER — AMITRIPTYLINE HCL 50 MG PO TABS: ORAL_TABLET | ORAL | Status: DC

## 2012-02-11 NOTE — Progress Notes (Signed)
  Subjective:    Patient ID: Jessica Alexander, female    DOB: 1960/10/22, 51 y.o.   MRN: [id number]  HPI HA x 5 days, with nausea and occ vomiting. Dec appetite.  Has been dealing with a lot of stress.  Her daughter and granddaughter are living with her. In general has been having more frequent migraines, 3 x a week. Tried Advil - not working.  Tyelnol doesn't work.  Used to be on prophylaxix.  Prednisone taper doesn't work for her. She really feels stress has been her biggest trigger. She's been sleeping well overall but there is an 44-week-old living in the house.   Review of Systems     Objective:   Physical Exam  Constitutional: She is oriented to person, place, and time. She appears well-developed and well-nourished.  HENT:  Head: Normocephalic and atraumatic.  Right Ear: External ear normal.  Left Ear: External ear normal.  Nose: Nose normal.  Mouth/Throat: Oropharynx is clear and moist.       TMs and canals are clear.   Eyes: Conjunctivae normal and EOM are normal. Pupils are equal, round, and reactive to light.  Neck: Neck supple. No thyromegaly present.  Cardiovascular: Normal rate, regular rhythm and normal heart sounds.   Pulmonary/Chest: Effort normal and breath sounds normal. She has no wheezes.  Lymphadenopathy:    She has no cervical adenopathy.  Neurological: She is alert and oriented to person, place, and time.  Skin: Skin is warm and dry.  Psychiatric: She has a normal mood and affect.          Assessment & Plan:  Migraine - Discussed I think she needs to start prophylaxis.  Will start amitriptyline. She think she's taken in the past but can't remember exactly. She wants to stay away from Topamax if possible. For acute headaches she says maxalt worked well in the past. She prefers a tablet instead of the melts. F/u in 6 weeks.  Encouraged her to keep a headache diary. She declined the toradol injection bc supposed to go to dinner today. It is important to work our  lactation.

## 2012-02-24 ENCOUNTER — Encounter: Payer: Self-pay | Admitting: Family Medicine

## 2012-02-24 ENCOUNTER — Ambulatory Visit (INDEPENDENT_AMBULATORY_CARE_PROVIDER_SITE_OTHER): Payer: BC Managed Care – PPO | Admitting: Family Medicine

## 2012-02-24 VITALS — BP 113/69 | HR 68 | Temp 98.0°F | Ht 69.0 in | Wt 176.0 lb

## 2012-02-24 DIAGNOSIS — J40 Bronchitis, not specified as acute or chronic: Secondary | ICD-10-CM

## 2012-02-24 DIAGNOSIS — J329 Chronic sinusitis, unspecified: Secondary | ICD-10-CM

## 2012-02-24 MED ORDER — HYDROCODONE-HOMATROPINE 5-1.5 MG/5ML PO SYRP: 5.0000 mL | ORAL_SOLUTION | Freq: Every evening | ORAL | Status: DC | PRN

## 2012-02-24 MED ORDER — AMOXICILLIN-POT CLAVULANATE 875-125 MG PO TABS: 1.0000 | ORAL_TABLET | Freq: Two times a day (BID) | ORAL | Status: DC

## 2012-02-24 NOTE — Progress Notes (Signed)
  Subjective:    Patient ID: Jessica Alexander, female    DOB: 04/26/1961, 51 y.o.   MRN: [id number]  HPI Cough x 2 weeks, dry,  Some has ST and nasal congsteion and + HA. Coughing all night long.  Facial pain on the right side.  Right ear feels stopped up.  Taking tylenol sinus with no relief.  Voice comes nad goes.  Advil for HA and helps some. Tried flonase as well. Chest hurst with the cough.  Cough is keeping her awake at night.    Review of Systems     Objective:   Physical Exam  Constitutional: She is oriented to person, place, and time. She appears well-developed and well-nourished.  HENT:  Head: Normocephalic and atraumatic.  Right Ear: External ear normal.  Left Ear: External ear normal.  Nose: Nose normal.  Mouth/Throat: Oropharynx is clear and moist.       TMs and canals are clear.   Eyes: Conjunctivae normal and EOM are normal. Pupils are equal, round, and reactive to light.  Neck: Neck supple. No thyromegaly present.  Cardiovascular: Normal rate, regular rhythm and normal heart sounds.   Pulmonary/Chest: Effort normal and breath sounds normal. She has no wheezes.  Lymphadenopathy:    She has no cervical adenopathy.  Neurological: She is alert and oriented to person, place, and time.  Skin: Skin is warm and dry.  Psychiatric: She has a normal mood and affect.          Assessment & Plan:  Sinusitis/bronchitis - continue symtpomatic care. Will tx with Augemtin. Call if not better in one week.  Will send over cough syrup as well.

## 2012-02-24 NOTE — Patient Instructions (Signed)

## 2012-02-26 ENCOUNTER — Other Ambulatory Visit: Payer: Self-pay | Admitting: Family Medicine

## 2012-02-28 ENCOUNTER — Other Ambulatory Visit: Payer: Self-pay | Admitting: *Deleted

## 2012-02-28 DIAGNOSIS — G4720 Circadian rhythm sleep disorder, unspecified type: Secondary | ICD-10-CM

## 2012-02-28 MED ORDER — TEMAZEPAM 30 MG PO CAPS: 30.0000 mg | ORAL_CAPSULE | Freq: Every evening | ORAL | Status: DC | PRN

## 2012-03-24 ENCOUNTER — Ambulatory Visit: Payer: BC Managed Care – PPO | Admitting: Family Medicine

## 2012-04-27 ENCOUNTER — Other Ambulatory Visit: Payer: Self-pay | Admitting: *Deleted

## 2012-04-27 DIAGNOSIS — G4720 Circadian rhythm sleep disorder, unspecified type: Secondary | ICD-10-CM

## 2012-04-27 MED ORDER — TEMAZEPAM 30 MG PO CAPS: 30.0000 mg | ORAL_CAPSULE | Freq: Every evening | ORAL | Status: DC | PRN

## 2012-05-05 ENCOUNTER — Emergency Department
Admission: EM | Admit: 2012-05-05 | Discharge: 2012-05-05 | Disposition: A | Discharge status: Home / Self Care | Payer: BC Managed Care – PPO | Source: Home / Self Care | Attending: Family Medicine | Admitting: Family Medicine

## 2012-05-05 ENCOUNTER — Encounter: Payer: Self-pay | Admitting: *Deleted

## 2012-05-05 ENCOUNTER — Telehealth: Payer: Self-pay | Admitting: *Deleted

## 2012-05-05 DIAGNOSIS — A088 Other specified intestinal infections: Secondary | ICD-10-CM

## 2012-05-05 DIAGNOSIS — A084 Viral intestinal infection, unspecified: Secondary | ICD-10-CM

## 2012-05-05 MED ORDER — PROMETHAZINE HCL 25 MG PO TABS: 25.0000 mg | ORAL_TABLET | Freq: Four times a day (QID) | ORAL | Status: DC | PRN

## 2012-05-05 MED ORDER — PROMETHAZINE HCL 25 MG/ML IJ SOLN
25.0000 mg | Freq: Once | INTRAMUSCULAR | Status: AC
  Administered 2012-05-05: 25 mg via INTRAMUSCULAR

## 2012-05-05 MED ORDER — ONDANSETRON 4 MG PO TBDP: 4.0000 mg | ORAL_TABLET | Freq: Three times a day (TID) | ORAL | Status: DC | PRN

## 2012-05-05 NOTE — Telephone Encounter (Signed)
Pt notified of MD instructions

## 2012-05-05 NOTE — Telephone Encounter (Signed)
Pt calls and states that she has been vomiting, diarrhea, severe H/A, fever of 102.0 since Wednesday this week. Any suggestions on what she can do or take

## 2012-05-05 NOTE — ED Notes (Signed)
Patient c/o N/V/D, HA x 3 days. Last eaten 3 days ago, "sipping" ginger ale.

## 2012-05-05 NOTE — Telephone Encounter (Signed)
Rec go to UC if still vomiting on Day 3.  May needs fluids at this point.

## 2012-05-05 NOTE — ED Provider Notes (Signed)
History     CSN: [id number]  Arrival date & time 05/05/12  1529   First MD Initiated Contact with Patient 05/05/12 1544      Chief Complaint  Patient presents with  . Emesis  . Diarrhea   HPI  N/V/D x 2 days.  Had sudden onset in the evening 2 days ago.  Has had persisted sxs since this point.  Emesis NBNB. Now clear as pt has been only able to tolerate liquids  Diarrhea NBNB. 5-6 BMs per day. This has also been clear.  No fevers or chills.  Mild abdominal pain. Diffuse. Unsure of sick contacts.  Baseline hx/o migraines.  Has had flare since onset of sxs.  Still urinating > 2 x per day albeit decreased.  Past Medical History  Diagnosis Date  . Endometriosis   . Pneumonia   . WPW syndrome     Past Surgical History  Procedure Date  . Foot surgery 06/2010  . Vaginal hysterectomy 2003  . Uterine tac 2001    Family History  Problem Relation Age of Onset  . Hypertension Mother   . Hypertension Father   . Stroke Mother     x 2  . Alzheimer's disease Father     History  Substance Use Topics  . Smoking status: Never Smoker   . Smokeless tobacco: Never Used  . Alcohol Use: Yes     Comment: rarely    OB History    Grav Para Term Preterm Abortions TAB SAB Ect Mult Living   2 2 2       2       Review of Systems  All other systems reviewed and are negative.    Allergies  Sumatriptan and Clarithromycin  Home Medications   Current Outpatient Rx  Name  Route  Sig  Dispense  Refill  . ALBUTEROL SULFATE HFA 108 (90 BASE) MCG/ACT IN AERS   Inhalation   Inhale 2 puffs into the lungs every 3 (three) hours as needed for wheezing.   1 Inhaler   2   . AMITRIPTYLINE HCL 50 MG PO TABS      TAKE 1/2 TABLET BY MOUTH NIGHTLY AT BEDTIME FOR ONE WEEK. INCREASE TO1 TAB. MAY INCREASE TO 2 IF NEEDED.   60 tablet   0   . FLUTICASONE PROPIONATE 50 MCG/ACT NA SUSP   Nasal   Place 2 sprays into the nose daily.   16 g   2   . HYDROCODONE-HOMATROPINE 5-1.5 MG/5ML PO  SYRP   Oral   Take 5 mLs by mouth at bedtime as needed for cough.   180 mL   0   . RIZATRIPTAN BENZOATE 10 MG PO TABS   Oral   Take 1 tablet (10 mg total) by mouth as needed for migraine. May repeat in 2 hours if needed   6 tablet   1   . TEMAZEPAM 30 MG PO CAPS   Oral   Take 1 capsule (30 mg total) by mouth at bedtime as needed.   30 capsule   0     BP 107/58  Pulse 69  Temp 98.9 F (37.2 C) (Oral)  Resp 12  Ht 5\' 9"  (1.753 m)  Wt 175 lb (79.379 kg)  BMI 25.84 kg/m2  SpO2 100%  Physical Exam  Constitutional: She appears well-developed and well-nourished.  HENT:  Head: Normocephalic and atraumatic.  Eyes: Conjunctivae normal are normal. Pupils are equal, round, and reactive to light.  Neck: Normal range of motion.  Neck supple.  Cardiovascular: Normal rate and regular rhythm.   Pulmonary/Chest: Effort normal and breath sounds normal.  Abdominal: Soft.       Hyperactive bowel sounds.  Diffuse mild abdominal pain  Musculoskeletal: Normal range of motion.  Neurological: She is alert.  Skin: Skin is warm.       < 2 sec cap refill    ED Course  Procedures (including critical care time)  Labs Reviewed - No data to display No results found.   1. Viral gastroenteritis       MDM  Phenergan 25mg  IM x1. Pt has a ride home.  Discussed suppotive care, fluids and GI red flags.  Rx for phenergan and zofran given.  Otherwise follow up as needed.      The patient and/or caregiver has been counseled thoroughly with regard to treatment plan and/or medications prescribed including dosage, schedule, interactions, rationale for use, and possible side effects and they verbalize understanding. Diagnoses and expected course of recovery discussed and will return if not improved as expected or if the condition worsens. Patient and/or caregiver verbalized understanding.             Doree Albee, MD 05/05/12 1630

## 2012-06-07 ENCOUNTER — Other Ambulatory Visit: Payer: Self-pay

## 2012-06-07 DIAGNOSIS — G4720 Circadian rhythm sleep disorder, unspecified type: Secondary | ICD-10-CM

## 2012-06-07 MED ORDER — TEMAZEPAM 30 MG PO CAPS: 30.0000 mg | ORAL_CAPSULE | Freq: Every evening | ORAL | Status: DC | PRN

## 2012-06-19 ENCOUNTER — Ambulatory Visit (INDEPENDENT_AMBULATORY_CARE_PROVIDER_SITE_OTHER): Payer: BC Managed Care – PPO | Admitting: Family Medicine

## 2012-06-19 ENCOUNTER — Encounter: Payer: Self-pay | Admitting: Family Medicine

## 2012-06-19 VITALS — BP 110/68 | HR 63 | Ht 69.0 in | Wt 182.0 lb

## 2012-06-19 DIAGNOSIS — F32A Depression, unspecified: Secondary | ICD-10-CM

## 2012-06-19 DIAGNOSIS — G43909 Migraine, unspecified, not intractable, without status migrainosus: Secondary | ICD-10-CM

## 2012-06-19 DIAGNOSIS — F341 Dysthymic disorder: Secondary | ICD-10-CM

## 2012-06-19 DIAGNOSIS — F430 Acute stress reaction: Secondary | ICD-10-CM

## 2012-06-19 DIAGNOSIS — F411 Generalized anxiety disorder: Secondary | ICD-10-CM

## 2012-06-19 DIAGNOSIS — F329 Major depressive disorder, single episode, unspecified: Secondary | ICD-10-CM

## 2012-06-19 MED ORDER — BUPROPION HCL ER (XL) 150 MG PO TB24: 150.0000 mg | ORAL_TABLET | Freq: Every day | ORAL | Status: DC

## 2012-06-19 NOTE — Progress Notes (Signed)
  Subjective:    Patient ID: Jessica Alexander, female    DOB: [birth year], 52 y.o.   MRN: [id number]  HPI Migraines- Decided to f/u with her neuroloigst. Last time I saw her was in the fall she was complaining of increasing headaches at that time. We decided to try amitriptyline since she had side effects with Topamax in the past. She felt like it did not work well for her. She  Is reallly stressed right now.  Has been tearful and emotional.  Doesn't sleep well. She's had to take medications periodically.. Feels really anxious lately. She says she just feels constantly on age. She's been dropping things..  Marriage is not going well and her daughter and granddaughter are still living with her. Says feels her daughter taking advantage of her, in respect to her watching her grandchild which she does every other day. Her husband is not wanting to deal with their failing marriage. She's not currently in any type of counseling or therapy. She says she wouldn't have time for anything like that, especially with taking care of her granddaughter.   Review of Systems     Objective:   Physical Exam  Constitutional: She is oriented to person, place, and time. She appears well-developed and well-nourished.  HENT:  Head: Normocephalic and atraumatic.  Eyes: Conjunctivae and EOM are normal. Pupils are equal, round, and reactive to light.  Cardiovascular: Normal rate, regular rhythm and normal heart sounds.   Pulmonary/Chest: Effort normal and breath sounds normal. She has no wheezes.  Neurological: She is alert and oriented to person, place, and time.  Skin: Skin is warm and dry.  Psychiatric: She has a normal mood and affect.          Assessment & Plan:  Migraines - still not well controlled. She's continued to work with her neurologist on that. She's currently weaning off her Depakote dose. She does feel like it's been helpful.  Depression/anxiety , secondary to acute situational stress- discussed  options. I recommended therapy or counseling. I really think this would be the most helpful thing for her. She says she really doesn't have the time and is not able to commit to at this point. She says if her daughter moves out in March, which is the current plan, and she would be potentially interested in a referral. I strong encouraged her to let me know if any point time she changes her mind. In the meantime we did discuss potential for medications. I think she would be a good candidate for Wellbutrin since it does not interact with the Depakote. Patient says she would like to try. We'll start with the 50 mg and release tablet. Followup in 5 weeks. We did discuss potential side effects, risks and benefits of the medication.  Time spent 25 minutes, greater than 50% spent in counseling about her depression and anxiety.

## 2012-07-05 ENCOUNTER — Other Ambulatory Visit: Payer: Self-pay | Admitting: *Deleted

## 2012-07-05 DIAGNOSIS — G4720 Circadian rhythm sleep disorder, unspecified type: Secondary | ICD-10-CM

## 2012-07-05 MED ORDER — TEMAZEPAM 30 MG PO CAPS: 30.0000 mg | ORAL_CAPSULE | Freq: Every evening | ORAL | Status: DC | PRN

## 2012-07-05 NOTE — Telephone Encounter (Signed)
Med refilled.

## 2012-07-25 ENCOUNTER — Ambulatory Visit (INDEPENDENT_AMBULATORY_CARE_PROVIDER_SITE_OTHER): Payer: BC Managed Care – PPO | Admitting: Family Medicine

## 2012-07-25 ENCOUNTER — Encounter: Payer: Self-pay | Admitting: Family Medicine

## 2012-07-25 VITALS — BP 123/79 | HR 75 | Wt 184.0 lb

## 2012-07-25 DIAGNOSIS — G4700 Insomnia, unspecified: Secondary | ICD-10-CM

## 2012-07-25 DIAGNOSIS — F329 Major depressive disorder, single episode, unspecified: Secondary | ICD-10-CM

## 2012-07-25 DIAGNOSIS — F32A Depression, unspecified: Secondary | ICD-10-CM

## 2012-07-25 DIAGNOSIS — F341 Dysthymic disorder: Secondary | ICD-10-CM

## 2012-07-25 NOTE — Progress Notes (Signed)
  Subjective:    Patient ID: Jessica Alexander, female    DOB: 12-25-1960, 52 y.o.   MRN: [id number]  HPI  Anxiety/Depression - She and her husband are trying to work on things.  Says she is missing her daughter and her grandbaby who recenlty moved up.  She really doesn't want to take anything on a daily basis. She would rather take something prn for her "nerves".  She is not sleeping well.  She is not really sure how much the Wellbutrin has helped her and has been on it for 5 weeks.  No SE on the medication. She said at this point she just doesn't want to be dependent on medication. She feels like she should be strong enough to work through the difficulties in her marriage as well as some difficulties in relationship with her daughter and missing her grandbaby on her own.  Review of Systems     Objective:   Physical Exam  Constitutional: She is oriented to person, place, and time. She appears well-developed and well-nourished.  HENT:  Head: Normocephalic and atraumatic.  Eyes: Conjunctivae and EOM are normal.  Cardiovascular: Normal rate.   Pulmonary/Chest: Effort normal.  Neurological: She is alert and oriented to person, place, and time.  Skin: Skin is dry. No pallor.  Psychiatric: She has a normal mood and affect. Her behavior is normal.          Assessment & Plan:  Anxiety/Depression GAD-7 score of 4 and PHQ-9 score of 5. I encouraged to stay on the medication for now until her mood is more stable and her life is more stable.  He still seems like she is really struggling and out of sorts me. Even though her gad 7 and PHQ 9 score are okay. Certainly the other option would be to wean off the medication and just give her a small quantity of Xanax which is 10 tabs that she sees only as needed for rescue. I explained to her the 10 tabs should definitely last more than 30 days if she's using 10 a month and that really means she needs to be on a controller medication. She really wants to  think about this overnight and then call me back with her decision. I think this is a good idea because she really needs to decide which way she wants to go. I still recommend counseling and therapy as well.  Insomnia-this is clearly secondary to her anxiety and depression. I think that we can get this under better control her sleep we'll follow. Except her to decide if she wants to continue the Wellbutrin or wean it off.  Time spent 25 mint, >50% spent in cousneling about her anxiety/depression adn medications.

## 2012-08-09 ENCOUNTER — Other Ambulatory Visit: Payer: Self-pay | Admitting: *Deleted

## 2012-08-09 DIAGNOSIS — G4720 Circadian rhythm sleep disorder, unspecified type: Secondary | ICD-10-CM

## 2012-08-09 MED ORDER — TEMAZEPAM 30 MG PO CAPS: 30.0000 mg | ORAL_CAPSULE | Freq: Every evening | ORAL | Status: DC | PRN

## 2012-09-06 ENCOUNTER — Other Ambulatory Visit: Payer: Self-pay | Admitting: Physician Assistant

## 2012-09-06 DIAGNOSIS — G4720 Circadian rhythm sleep disorder, unspecified type: Secondary | ICD-10-CM

## 2012-09-06 MED ORDER — TEMAZEPAM 30 MG PO CAPS: 30.0000 mg | ORAL_CAPSULE | Freq: Every evening | ORAL | Status: DC | PRN

## 2013-01-11 ENCOUNTER — Other Ambulatory Visit: Payer: Self-pay | Admitting: Family Medicine

## 2013-01-11 DIAGNOSIS — G4720 Circadian rhythm sleep disorder, unspecified type: Secondary | ICD-10-CM

## 2013-01-11 MED ORDER — TEMAZEPAM 30 MG PO CAPS: 30.0000 mg | ORAL_CAPSULE | Freq: Every evening | ORAL | Status: DC | PRN

## 2013-05-07 ENCOUNTER — Other Ambulatory Visit: Payer: Self-pay | Admitting: Family Medicine

## 2013-05-22 ENCOUNTER — Telehealth: Payer: Self-pay | Admitting: *Deleted

## 2013-05-22 MED ORDER — PROMETHAZINE HCL 25 MG PO TABS: 25.0000 mg | ORAL_TABLET | Freq: Four times a day (QID) | ORAL | Status: DC | PRN

## 2013-05-22 NOTE — Telephone Encounter (Signed)
Prescription sent to pharmacy. If she's not better within 24 hours then please let us know.

## 2013-05-22 NOTE — Telephone Encounter (Signed)
Pt called stating that she has been throwing up and has had low grade temp since Sunday. She is asking if you will send something over to her pharmacy for the n&v. She hasn't been seen since 07/2012. Please advise.  Jessica CoryMisty Sneijder [name], LPN

## 2013-05-22 NOTE — Telephone Encounter (Signed)
LMOM for pt with response.  Meyer CoryMisty Ginger Leeth, LPN

## 2013-05-28 ENCOUNTER — Encounter: Payer: BC Managed Care – PPO | Admitting: Family Medicine

## 2013-06-07 ENCOUNTER — Encounter: Payer: BC Managed Care – PPO | Admitting: Family Medicine

## 2013-06-10 ENCOUNTER — Other Ambulatory Visit: Payer: Self-pay | Admitting: Family Medicine

## 2013-06-19 ENCOUNTER — Encounter: Payer: BC Managed Care – PPO | Admitting: Family Medicine

## 2013-06-19 ENCOUNTER — Ambulatory Visit: Payer: BC Managed Care – PPO | Admitting: Physician Assistant

## 2013-06-20 ENCOUNTER — Encounter: Payer: Self-pay | Admitting: Physician Assistant

## 2013-06-20 ENCOUNTER — Ambulatory Visit (INDEPENDENT_AMBULATORY_CARE_PROVIDER_SITE_OTHER): Payer: BC Managed Care – PPO | Admitting: Physician Assistant

## 2013-06-20 VITALS — BP 117/70 | HR 67 | Wt 196.0 lb

## 2013-06-20 DIAGNOSIS — S29012A Strain of muscle and tendon of back wall of thorax, initial encounter: Secondary | ICD-10-CM

## 2013-06-20 DIAGNOSIS — G4700 Insomnia, unspecified: Secondary | ICD-10-CM

## 2013-06-20 DIAGNOSIS — M62838 Other muscle spasm: Secondary | ICD-10-CM

## 2013-06-20 DIAGNOSIS — S239XXA Sprain of unspecified parts of thorax, initial encounter: Secondary | ICD-10-CM

## 2013-06-20 MED ORDER — TEMAZEPAM 30 MG PO CAPS: ORAL_CAPSULE | ORAL | Status: DC

## 2013-06-20 MED ORDER — CYCLOBENZAPRINE HCL 10 MG PO TABS: 10.0000 mg | ORAL_TABLET | Freq: Three times a day (TID) | ORAL | Status: DC | PRN

## 2013-06-20 MED ORDER — MELOXICAM 15 MG PO TABS: 15.0000 mg | ORAL_TABLET | Freq: Every day | ORAL | Status: DC

## 2013-06-22 NOTE — Progress Notes (Signed)
   Subjective:    Patient ID: Jessica Alexander, female    DOB: 1961/01/31, 53 y.o.   MRN: [id number]  HPI Pt presents to the clinic for med refill for insomnia. Pt is controlled on temazepam with no problems.   She also complains of right shoulder/upper back pain. 2 weeks ago she picked up her granddaughter and she immediately felt a pull in her right upper back area. Her back and into neck continues to hurt. It is very tender touch. Some pain shoots into her upper right side of back when she turns her head or tries to pick anything up. Denies and numbness or tingling into right arm or fingers. She has tried ibuprofen with no real benefit. Denies any trauma to neck or shoulder.    Review of Systems     Objective:   Physical Exam  Constitutional: She is oriented to person, place, and time. She appears well-developed and well-nourished.  HENT:  Head: Normocephalic and atraumatic.  Cardiovascular: Normal rate, regular rhythm and normal heart sounds.   Pulmonary/Chest: Effort normal and breath sounds normal.  Musculoskeletal:  ROM of right shoulder full with some discomfort in upper right back around scapula. Tender to palpation over rhomboid muscles or right upper back. Hand grip 5/5. Strength of right arm 5/5. No pain with palpation over cspine. Neck ROM about 50 degrees to the right due to pain, 90 degrees to the left.   Neurological: She is alert and oriented to person, place, and time.  Psychiatric: She has a normal mood and affect. Her behavior is normal.          Assessment & Plan:  Insomnia- refilled temazepam. Controlled. Pt scheduled for CPE with metheney in 2 weeks will get labs then.   Muscle spasms of the neck/rhomboid muscle sprain of right side- Rhomboid stretches were given for pt to start. Gave flexeril, mobic to use as needed. Discussed icing. Consider massage of upper back. Call if pain not improving or if pain progresses.   Spent 30 minutes with pt going over  stretches for rhomboid strain and counseling on right back pain.

## 2013-12-24 ENCOUNTER — Other Ambulatory Visit: Payer: Self-pay | Admitting: Physician Assistant

## 2014-01-28 ENCOUNTER — Other Ambulatory Visit: Payer: Self-pay | Admitting: Physician Assistant

## 2014-02-27 ENCOUNTER — Other Ambulatory Visit: Payer: Self-pay | Admitting: Physician Assistant

## 2014-03-04 ENCOUNTER — Encounter: Payer: Self-pay | Admitting: Physician Assistant

## 2014-03-25 ENCOUNTER — Ambulatory Visit (INDEPENDENT_AMBULATORY_CARE_PROVIDER_SITE_OTHER): Payer: BC Managed Care – PPO | Admitting: Family Medicine

## 2014-03-25 ENCOUNTER — Encounter: Payer: Self-pay | Admitting: Family Medicine

## 2014-03-25 VITALS — BP 111/80 | HR 77 | Temp 98.6°F | Ht 69.0 in | Wt 196.0 lb

## 2014-03-25 DIAGNOSIS — M79652 Pain in left thigh: Secondary | ICD-10-CM | POA: Diagnosis not present

## 2014-03-25 MED ORDER — MELOXICAM 15 MG PO TABS: 15.0000 mg | ORAL_TABLET | Freq: Every day | ORAL | Status: DC | PRN

## 2014-03-25 MED ORDER — TEMAZEPAM 30 MG PO CAPS: ORAL_CAPSULE | ORAL | Status: DC

## 2014-03-25 NOTE — Patient Instructions (Signed)
Call if not better in 2-3 weeks.  Try to do the exercises and take the anti-inflammatory.

## 2014-03-25 NOTE — Progress Notes (Signed)
   Subjective:    Patient ID: Jessica Alexander, female    DOB: 09/17/1960, 53 y.o.   MRN: [id number]  HPI L thigh x 10 days pt denies any injury. she has a small bruise on outer thigh and has been using heat for relief. Has used heat.  Using Advil 800 mg . Not helping much. Getting painful to walk.   Review of Systems     Objective:   Physical Exam  Constitutional: She appears well-developed and well-nourished.  HENT:  Head: Normocephalic and atraumatic.  Musculoskeletal:  Tender over the left outer thigh from the head down to the knee. Knee and hip was normal range of motion. She have some discomfort with hip abduction against resistance and hip flexion against resistance. Jessica Alexander has some discomfort with stretching the flexors out.  Otherwise strength out of 5 at the hip, knee and ankle. Patellar reflexes 1+ bilaterally. She does have a small bruise over the left lateral thigh that is stating that's probably the size of a nickel.  Skin: Skin is warm and dry.  Psychiatric: She has a normal mood and affect. Her behavior is normal.          Assessment & Plan:  Left outer thigh pain - lateral hip flexor strain.  Discuss diagnosis. Water switched meloxicam which is once a day. Give a handout on specific stretches to do on her own. Since the thing started a week and a half ago okay to use heat. If not improving over the next 2 to 3 weeks and please let me know.

## 2014-07-25 ENCOUNTER — Other Ambulatory Visit: Payer: Self-pay | Admitting: Family Medicine

## 2014-08-29 ENCOUNTER — Other Ambulatory Visit: Payer: Self-pay | Admitting: Family Medicine

## 2014-09-24 ENCOUNTER — Other Ambulatory Visit: Payer: Self-pay | Admitting: Family Medicine

## 2014-10-04 ENCOUNTER — Ambulatory Visit (INDEPENDENT_AMBULATORY_CARE_PROVIDER_SITE_OTHER): Payer: BLUE CROSS/BLUE SHIELD | Admitting: Sports Medicine

## 2014-10-04 ENCOUNTER — Encounter: Payer: Self-pay | Admitting: Sports Medicine

## 2014-10-04 VITALS — BP 121/84 | HR 87 | Ht 69.0 in | Wt 194.0 lb

## 2014-10-04 DIAGNOSIS — M5412 Radiculopathy, cervical region: Secondary | ICD-10-CM | POA: Diagnosis not present

## 2014-10-04 MED ORDER — MELOXICAM 15 MG PO TABS: ORAL_TABLET | ORAL | Status: DC

## 2014-10-04 MED ORDER — TEMAZEPAM 30 MG PO CAPS: ORAL_CAPSULE | ORAL | Status: DC

## 2014-10-04 MED ORDER — PREDNISONE 50 MG PO TABS: ORAL_TABLET | ORAL | Status: DC

## 2014-10-04 MED ORDER — CYCLOBENZAPRINE HCL 10 MG PO TABS: ORAL_TABLET | ORAL | Status: DC
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
## 2014-10-04 NOTE — Assessment & Plan Note (Signed)
Patient will attempt to obtain MRI disc. This represents a left-sided C8 distribution radiculopathy. Formal physical therapy, prednisone, meloxicam, Flexeril at bedtime. Return to see me in one month, at that point we should have the MRI disc and can plan intervention if no better.

## 2014-10-09 ENCOUNTER — Ambulatory Visit: Payer: BLUE CROSS/BLUE SHIELD | Admitting: Physical Therapy

## 2014-10-17 ENCOUNTER — Encounter: Payer: Self-pay | Admitting: Physical Therapy

## 2014-10-17 ENCOUNTER — Ambulatory Visit (INDEPENDENT_AMBULATORY_CARE_PROVIDER_SITE_OTHER): Payer: BLUE CROSS/BLUE SHIELD | Admitting: Physical Therapy

## 2014-10-17 DIAGNOSIS — M2560 Stiffness of unspecified joint, not elsewhere classified: Secondary | ICD-10-CM

## 2014-10-17 DIAGNOSIS — R52 Pain, unspecified: Secondary | ICD-10-CM

## 2014-10-17 DIAGNOSIS — R531 Weakness: Secondary | ICD-10-CM
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
## 2014-10-17 NOTE — Patient Instructions (Addendum)
Elevation: Shrug (Distal Resist) [place] [id number]   Lift shoulders straight up, then return. Maintain same speed up and down. Avoid moving head and neck forward. Repeat _10___ times per set. Do _1___ sets per session. Do 4-5___ sessions per week.  Head Press With Chin Tuck   Tuck chin SLIGHTLY toward chest, keep mouth closed. Feel weight on back of head. Increase weight by pressing head down. Hold ___ seconds. Relax. Repeat ___ times. Surface: floor   Copyright  VHI. All rights reserved.  AROM: Neck Rotation   Turn head slowly to look over one shoulder, then the other. Hold each position ____ seconds. Repeat ____ times per set. Do ____ sets per session. Do ____ sessions per day.  http://orth.exer.us/294   Copyright  VHI. All rights reserved.  Flexibility: Neck Retraction   Pull head straight back, keeping eyes and jaw level. Repeat ____ times per set. Do ____ sets per session. Do ____ sessions per day.  http://orth.exer.us/344   Copyright  VHI. All rights reserved.  Scapular Retraction (Standing)   With arms at sides, pinch shoulder blades together. Repeat ____ times per set. Do ____ sets per session. Do ____ sessions per day.  http://orth.exer.us/944   Copyright  VHI. All rights reserved.  ROM: Pendulum (Circular)   Let right arm move in circle clockwise, then counterclockwise, by rocking body weight in circular pattern. Circle ____ times each direction per set. Do ____ sets per session. Do ____ sessions per day.  http://orth.exer.us/794   Copyright  VHI. All rights reserved.

## 2014-10-17 NOTE — Patient Instructions (Signed)
Elevation: Shrug (Distal Resist) [place] [id number]   Lift shoulders straight up, then return. Maintain same speed up and down. Avoid moving head and neck forward. Repeat _10___ times per set. Do _1___ sets per session. Do 4-5___ sessions per week.  Copyright  VHI. All rights reserved.

## 2014-10-18 ENCOUNTER — Encounter: Payer: BLUE CROSS/BLUE SHIELD | Admitting: Physical Therapy

## 2014-10-28 ENCOUNTER — Encounter: Payer: BLUE CROSS/BLUE SHIELD | Admitting: Physical Therapy

## 2014-10-31 ENCOUNTER — Ambulatory Visit (INDEPENDENT_AMBULATORY_CARE_PROVIDER_SITE_OTHER): Payer: BLUE CROSS/BLUE SHIELD | Admitting: Physical Therapy

## 2014-10-31 DIAGNOSIS — R52 Pain, unspecified: Secondary | ICD-10-CM | POA: Diagnosis not present

## 2014-10-31 DIAGNOSIS — M2560 Stiffness of unspecified joint, not elsewhere classified: Secondary | ICD-10-CM

## 2014-10-31 DIAGNOSIS — R531 Weakness: Secondary | ICD-10-CM | POA: Diagnosis not present

## 2014-10-31 NOTE — Therapy (Signed)
[place], [place], [address] Phone: [phone]   Fax:  [phone]  Physical Therapy Treatment  Patient Details  Name: Jessica Alexander MRN: [id number] Date of Birth: February 05, 1961 Referring Provider:  Silverio Decamp,*  Encounter Date: 10/31/2014      PT End of Session - 10/31/14 1443    Visit Number 2   Number of Visits 12   Date for PT Re-Evaluation 11/28/14   PT Start Time 7001   PT Stop Time 1540   PT Time Calculation (min) 55 min   Activity Tolerance Patient limited by pain      Past Medical History  Diagnosis Date  . Endometriosis   . Pneumonia   . WPW syndrome     Past Surgical History  Procedure Laterality Date  . Foot surgery  06/2010  . Vaginal hysterectomy  2003  . Uterine tac  2001    There were no vitals filed for this visit.  Visit Diagnosis:  Stiffness of joints, multiple sites  Pain of multiple sites  Weakness      Subjective Assessment - 10/31/14 1446    Subjective Pt reports she drove to beach, able to relax; reduced pain - not radiating into Lt today.  Still unable to sleep on stomach dur to pain. Only taking advil 2x/day. Has been compliant with HEP, "I notice a difference"   Patient Stated Goals help her daughter with her babies without pain, drive without pain, look up and turn her head without pain, exercise - spin classes, swimming weights etc.    Currently in Pain? Yes   Pain Score 6    Pain Location Neck   Pain Orientation Left   Pain Descriptors / Indicators Sore;Stabbing   Aggravating Factors  driving, sleeping position, LUE use    Pain Relieving Factors heat, medicine.             [facility] PT Assessment - 10/31/14 0001    AROM   Left Shoulder Flexion 120 Degrees  after AAROM with cane, supine   Left Shoulder ABduction --  90 deg in supine    Cervical - Right Side Bend 25   Cervical - Left Side Bend 35   Cervical - Right Rotation 50    Cervical - Left Rotation 35  with pain                      [facility] Adult PT Treatment/Exercise - 10/31/14 0001    Neck Exercises: Supine   Cervical Isometrics 10 reps  head presses   Other Supine Exercise shoulder presses x 3 sec hold x 10    Shoulder Exercises: Supine   Flexion AAROM;10 reps   Flexion Limitations with cane   Other Supine Exercises Hooklying: snow angels x 10  (only able to abd to 90 - cause radiating sx's to Lt elbow; stopped)    Shoulder Exercises: ROM/Strengthening   UBE (Upper Arm Bike) L1: attempted - unable to tolerate (completed 15 sec each direction)    Modalities   Modalities Electrical Stimulation;Moist Heat;Traction   Moist Heat Therapy   Number Minutes Moist Heat 10 Minutes   Moist Heat Location Cervical   Electrical Stimulation   Electrical Stimulation Location bilateral upper trap and cervical paraspinals   Electrical Stimulation Action IFC   Electrical Stimulation Parameters x 15 min,  to tolerance    Electrical Stimulation Goals Pain   Traction   Type of Traction Cervical  Min (lbs) 5   Max (lbs) 10   Hold Time 60   Rest Time 20   Time 10    Manual Therapy   Manual Therapy Myofascial release   Myofascial Release suboccipital release.                   PT Short Term Goals - 10/17/14 1118    PT SHORT TERM GOAL #1   Title I with advanced HEP ( 11/07/14)   Time 3   Period Weeks   Status New   PT SHORT TERM GOAL #2   Title demo increased cervical ROM to WNL to allow her to look for traffic (11/07/14)   Time 3   Period Weeks   Status New   PT SHORT TERM GOAL #3   Title increase Lt shoulder ROM to [facility] to allow her to reach over head without pain (11/07/14)   Time 3   Period Weeks   Status New   PT SHORT TERM GOAL #4   Title report pain decreased =/> 50%  ( 11/07/14)   Time 3   Period Weeks   Status New           PT Long Term Goals - 10/17/14 1120    PT LONG TERM GOAL #1   Title I with advanced HEP (  11/28/14)   Time 6   Period Weeks   Status New   PT LONG TERM GOAL #2   Title demo Lt shoulder strength =/> 5-/5 to allow her to pick up and carry her grandchildren ( 11/28/14)   Time 6   Period Weeks   Status New   PT LONG TERM GOAL #3   Title Report pain decrease =/> 75% with daily activities (11/28/14)   Time 6   Period Weeks   Status New   PT LONG TERM GOAL #4   Title improve FOTO =/< 39% limited ( 11/28/14)   Time 6   Period Weeks   Status New               Plan - 10/31/14 1550    Clinical Impression Statement Pt demo improved cervical ROM, yet still limited in addition to limited shoulder ROM.  Pt reported symptoms into LUE with supine shoulder abduction, slightly resolved with traction and estim.  Pt tolerated initial cervical traction trial well.  No goals met, only 2nd visit.    Pt will benefit from skilled therapeutic intervention in order to improve on the following deficits Postural dysfunction;Decreased strength;Pain;Impaired UE functional use;Decreased range of motion   Rehab Potential Excellent   PT Frequency 2x / week   PT Duration 6 weeks   PT Treatment/Interventions Moist Heat;Traction;Ultrasound;Cryotherapy;Electrical Stimulation;Passive range of motion;Patient/family education;Manual techniques;Therapeutic exercise   PT Next Visit Plan Progress as tolerated. Continue traction.    Consulted and Agree with Plan of Care Patient        Problem List Patient Active Problem List   Diagnosis Date Noted  . Left cervical radiculopathy 10/04/2014  . BACK PAIN, LUMBAR, WITH RADICULOPATHY 10/23/2008  . MIGRAINE HEADACHE 09/11/2008  . MOOD DISORDER 08/26/2008  . AEROPHOBIA 08/26/2008  . ALLERGIC RHINITIS 08/26/2008   Kerin Perna, PTA 10/31/2014 6:05 PM  [place], [place], [address] Phone: [phone]   Fax:  [phone]

## 2014-11-05 ENCOUNTER — Encounter: Payer: Self-pay | Admitting: Sports Medicine

## 2014-11-05 ENCOUNTER — Ambulatory Visit (INDEPENDENT_AMBULATORY_CARE_PROVIDER_SITE_OTHER): Payer: BLUE CROSS/BLUE SHIELD | Admitting: Sports Medicine

## 2014-11-05 ENCOUNTER — Ambulatory Visit (INDEPENDENT_AMBULATORY_CARE_PROVIDER_SITE_OTHER): Payer: BLUE CROSS/BLUE SHIELD | Admitting: Family Medicine

## 2014-11-05 ENCOUNTER — Encounter: Payer: Self-pay | Admitting: Family Medicine

## 2014-11-05 VITALS — BP 114/74 | HR 88 | Ht 69.0 in | Wt 193.0 lb

## 2014-11-05 DIAGNOSIS — Z1231 Encounter for screening mammogram for malignant neoplasm of breast: Secondary | ICD-10-CM

## 2014-11-05 DIAGNOSIS — G4700 Insomnia, unspecified: Secondary | ICD-10-CM

## 2014-11-05 DIAGNOSIS — M5412 Radiculopathy, cervical region: Secondary | ICD-10-CM

## 2014-11-05 DIAGNOSIS — F39 Unspecified mood [affective] disorder: Secondary | ICD-10-CM

## 2014-11-05 MED ORDER — TEMAZEPAM 30 MG PO CAPS: ORAL_CAPSULE | ORAL | Status: DC
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
## 2014-11-05 NOTE — Assessment & Plan Note (Signed)
Clinically represents a left C8 radiculopathy. Continues to improve significantly formal PT. At this point because she is continuing to improve we will avoid any aggressive or invasive interventional treatment. Return to see me tentatively in 4 weeks, she has 2 more weeks of formal PT and if she does not improve she will call me, and I will simply order a left-sided C7-T1 interlaminar epidural. I did review her MRI, the results are up in the overview, MRI disc was returned to patient as she would need this prior to any injections.

## 2014-11-05 NOTE — Progress Notes (Signed)
  Subjective:    CC: Follow-up  HPI: Left C8 radiculopathy: Improving significantly with formal physical therapy, she has at least 2 more weeks, her neck pain and her radicular symptoms are nearly resolved. Happy with how things are going so far.  Past medical history, Surgical history, Family history not pertinant except as noted below, Social history, Allergies, and medications have been entered into the medical record, reviewed, and no changes needed.   Review of Systems: No fevers, chills, night sweats, weight loss, chest pain, or shortness of breath.   Objective:    General: Well Developed, well nourished, and in no acute distress.  Neuro: Alert and oriented x3, extra-ocular muscles intact, sensation grossly intact.  HEENT: Normocephalic, atraumatic, pupils equal round reactive to light, neck supple, no masses, no lymphadenopathy, thyroid nonpalpable.  Skin: Warm and dry, no rashes. Cardiac: Regular rate and rhythm, no murmurs rubs or gallops, no lower extremity edema.  Respiratory: Clear to auscultation bilaterally. Not using accessory muscles, speaking in full sentences. Neck: Negative spurling's Full neck range of motion Grip strength and sensation normal in bilateral hands Strength good C4 to T1 distribution No sensory change to C4 to T1 Reflexes normal  Impression and Recommendations:    I spent 25 minutes with this patient, greater than 50% was face-to-face time counseling regarding the above diagnoses

## 2014-11-06 ENCOUNTER — Ambulatory Visit (INDEPENDENT_AMBULATORY_CARE_PROVIDER_SITE_OTHER): Payer: BLUE CROSS/BLUE SHIELD | Admitting: Rehabilitative and Restorative Service Providers"

## 2014-11-06 ENCOUNTER — Encounter: Payer: Self-pay | Admitting: Rehabilitative and Restorative Service Providers"

## 2014-11-06 DIAGNOSIS — R531 Weakness: Secondary | ICD-10-CM

## 2014-11-06 DIAGNOSIS — R52 Pain, unspecified: Secondary | ICD-10-CM

## 2014-11-06 DIAGNOSIS — M2560 Stiffness of unspecified joint, not elsewhere classified: Secondary | ICD-10-CM | POA: Diagnosis not present

## 2014-11-06 NOTE — Therapy (Signed)
[facility] [address] [place], [place], [address] Phone: [phone]   Fax:  [phone]  Physical Therapy Treatment  Patient Details  Name: Jessica Alexander MRN: [id number] Date of Birth: 05-08-1960 Referring Provider:  Monica Becton,*  Encounter Date: 11/06/2014      PT End of Session - 11/06/14 1108    Visit Number 3   Number of Visits 12   Date for PT Re-Evaluation 11/28/14   PT Start Time 1108   PT Stop Time 1200   PT Time Calculation (min) 52 min   Activity Tolerance Patient limited by pain      Past Medical History  Diagnosis Date  . Endometriosis   . Pneumonia   . WPW syndrome     Past Surgical History  Procedure Laterality Date  . Foot surgery  06/2010  . Vaginal hysterectomy  2003  . Uterine tac  2001    There were no vitals filed for this visit.  Visit Diagnosis:  Stiffness of joints, multiple sites  Pain of multiple sites  Weakness      Subjective Assessment - 11/06/14 1108    Subjective Patient reports that she has had a bad night. She did a lot with helping her mother move - doing lifting, pushing a cart. Had a rough night, did not sleep well, could not get comfortable. More pain today - not moving neck well.   Currently in Pain? Yes   Pain Score 7    Pain Location Shoulder   Pain Orientation Left   Pain Descriptors / Indicators Aching;Tingling   Pain Type Acute pain   Pain Radiating Towards some radiating into Lt arm on an intermittent basis   Pain Onset More than a month ago   Pain Frequency Intermittent   Aggravating Factors  driving; sleeping sometimes; lifting; packing to move her mom; Lt UE use   Pain Relieving Factors heat; medication          [facility] Adult PT Treatment/Exercise - 11/06/14 0001    Neck Exercises: Standing   Neck Retraction 10 reps;3 secs   Neck Exercises: Supine   Neck Retraction 5 reps;5 secs   Other Supine Exercise shoulder presses x 3 sec  hold x 10    Other Supine Exercise nodding yes/no to promote improved cervical positioning and relaxation   Cryotherapy   Number Minutes Cryotherapy 15 Minutes   Cryotherapy Location Cervical;Shoulder   Type of Cryotherapy Ice pack   Manual Therapy   Manual Therapy Soft tissue mobilization;Myofascial release;Scapular mobilization;Joint mobilization;Manual Traction   Manual therapy comments working through neck and Lt shoulder area with patient in supine position various techniques   Joint Mobilization gentle c-spine mobs PA note greatest tenderness and tightness C3-C4 Lt   Soft tissue mobilization muscular tightness Lt trap/leveator/scaleni/ SCM/pecs/periscapular tissues   Myofascial Release suboccipital release.    Scapular Mobilization working through medial scapular area   Manual Traction manual cervical traction/pull through long arm Lt UE           PT Education - 11/06/14 1202    Education provided Yes   Education Details Postural influence on neck pathology; neck care suggestions; relaxation and deep    Person(s) Educated Patient   Methods Explanation;Demonstration;Tactile cues;Verbal cues;Handout   Comprehension Verbalized understanding;Returned demonstration          PT Short Term Goals - 10/17/14 1118    PT SHORT TERM GOAL #1   Title I with advanced HEP ( 11/07/14)  Time 3   Period Weeks   Status New   PT SHORT TERM GOAL #2   Title demo increased cervical ROM to WNL to allow her to look for traffic (11/07/14)   Time 3   Period Weeks   Status New   PT SHORT TERM GOAL #3   Title [facility] to allow her to reach over head without pain (11/07/14)   Time 3   Period Weeks   Status New   PT SHORT TERM GOAL #4   Title report pain decreased =/> 50%  ( 11/07/14)   Time 3   Period Weeks   Status New          PT Long Term Goals - 10/17/14 1120    PT LONG TERM GOAL #1   Title I with advanced HEP ( 11/28/14)   Time 6   Period Weeks   Status New    PT LONG TERM GOAL #2   Title demo Lt shoulder strength =/> 5-/5 to allow her to pick up and carry her grandchildren ( 11/28/14)   Time 6   Period Weeks   Status New   PT LONG TERM GOAL #3   Title Report pain decrease =/> 75% with daily activities (11/28/14)   Time 6   Period Weeks   Status New   PT LONG TERM GOAL #4   Title improve FOTO =/< 39% limited ( 11/28/14)   Time 6   Period Weeks   Status New          Plan - 11/06/14 1311    Clinical Impression Statement Patient presents with significant increase in pain and limited mobility today following a busy and more active day yesterday helping her mother move. She demonstrates muscle guarding with poor cervical and thoracic posture and alignment. She has significant muscular tigghtness to palpation through anterior/lateral/posterior cervical musculature, pecs, upper trap, leveator, periscapular musculature. She tolerated manual work including manual cervical traction with good improvement in pain and movement quality.   Pt will benefit from skilled therapeutic intervention in order to improve on the following deficits Postural dysfunction;Decreased strength;Pain;Impaired UE functional use;Decreased range of motion   Rehab Potential Excellent   Clinical Impairments Affecting Rehab Potential muscle guarding, poor posture and alignment   PT Frequency 2x / week   PT Duration 6 weeks   PT Treatment/Interventions Moist Heat;Traction;Ultrasound;Cryotherapy;Electrical Stimulation;Passive range of motion;Patient/family education;Manual techniques;Therapeutic exercise   PT Next Visit Plan Progress as tolerated. Continue manual work as indicated.    PT Home Exercise Plan Work on suggestions discussed, work to improve cervical posture and alignment, HEP   Consulted and Agree with Plan of Care Patient        Problem List Patient Active Problem List   Diagnosis Date Noted  . Left cervical radiculopathy 10/04/2014  . BACK PAIN, LUMBAR, WITH  RADICULOPATHY 10/23/2008  . MIGRAINE HEADACHE 09/11/2008  . Episodic mood disorder 08/26/2008  . AEROPHOBIA 08/26/2008  . ALLERGIC RHINITIS 08/26/2008    Celyn Rober MinionP Holt, PT, MPH 11/06/2014, 1:28 PM  [facility] [address] [place], [place], [address] Phone: [phone]   Fax:  [phone]

## 2014-11-06 NOTE — Patient Instructions (Signed)
Work to improve posture and alignment;  Try to relax!;  Attempt to lie down with head supported 5 min every 1-3 ours during the day; Roll onto side to sit up from lying down and sit down then lie onto side before rolling over; Nodding yes/no to identify tightness you are holding in your neck and shoulders; Work on posture and alignment - trying to lift chest, bringing head in good alignment over shoulders Try using ice for neck and shoulders several times each day - 15-20 min

## 2014-11-07 ENCOUNTER — Encounter: Payer: Self-pay | Admitting: Rehabilitative and Restorative Service Providers"

## 2014-11-07 ENCOUNTER — Other Ambulatory Visit: Payer: Self-pay | Admitting: *Deleted

## 2014-11-07 ENCOUNTER — Ambulatory Visit (INDEPENDENT_AMBULATORY_CARE_PROVIDER_SITE_OTHER): Payer: BLUE CROSS/BLUE SHIELD | Admitting: Rehabilitative and Restorative Service Providers"

## 2014-11-07 DIAGNOSIS — M5412 Radiculopathy, cervical region: Secondary | ICD-10-CM

## 2014-11-07 DIAGNOSIS — M2560 Stiffness of unspecified joint, not elsewhere classified: Secondary | ICD-10-CM

## 2014-11-07 DIAGNOSIS — R531 Weakness: Secondary | ICD-10-CM | POA: Diagnosis not present

## 2014-11-07 DIAGNOSIS — R52 Pain, unspecified: Secondary | ICD-10-CM | POA: Diagnosis not present

## 2014-11-07 MED ORDER — CYCLOBENZAPRINE HCL 10 MG PO TABS: ORAL_TABLET | ORAL | Status: DC

## 2014-11-07 NOTE — Patient Instructions (Signed)
Scapular Retraction (Standing)   With arms at sides, pinch shoulder blades down and back. Can stand with a swim noodle along your spine. Repeat _10___ times hold 10 sec. Do __several__ sessions per day.   Relax!!  Take time to do your stretching! Remember to stay up straight and tall - lift chest and pull shoulder blades down and back!

## 2014-11-07 NOTE — Therapy (Addendum)
[place], [place], [address] Phone: [phone]   Fax:  [phone]  Physical Therapy Treatment  Patient Details  Name: Jessica Alexander MRN: [id number] Date of Birth: 06/22/1960 Referring Provider:  Silverio Decamp,*  Encounter Date: 11/07/2014      PT End of Session - 11/07/14 1458    Visit Number 4   Number of Visits 12   Date for PT Re-Evaluation 11/28/14   PT Start Time 0202   PT Stop Time 0300   PT Time Calculation (min) 58 min   Activity Tolerance Patient limited by pain      Past Medical History  Diagnosis Date  . Endometriosis   . Pneumonia   . WPW syndrome     Past Surgical History  Procedure Laterality Date  . Foot surgery  06/2010  . Vaginal hysterectomy  2003  . Uterine tac  2001    There were no vitals filed for this visit.  Visit Diagnosis:  Stiffness of joints, multiple sites  Pain of multiple sites  Weakness      Subjective Assessment - 11/07/14 1406    Subjective Patient reports that she felt better after treatment yesterday but has been back to her moms assisting with and directing her mom's move. She felt better but by the end of the day yesterday symptoms had increased again. She did not sleep well. She did contact the doctor's office to ask about a muscle relaxant to help her rest and sleep. Trys to do her exercises at night. Just a bad time right now.   Currently in Pain? Yes   Pain Score 7    Pain Location Shoulder   Pain Orientation Left   Pain Descriptors / Indicators Aching;Tingling   Pain Type Acute pain   Pain Radiating Towards some radiating pain into the Lt arm - which was gone before she started moving her mom   Pain Onset More than a month ago   Pain Frequency Constant   Aggravating Factors  driving, sleeping, lifting, packing to move her mom; Lt UE use   Pain Relieving Factors heat; medication            [facility] PT Assessment -  11/07/14 0001    PROM   Overall PROM Comments PROM Lt shoulder pt in supine position   Left Shoulder Flexion 160 Degrees   Left Shoulder ABduction 155 Degrees   Left Shoulder Internal Rotation 65 Degrees   Left Shoulder External Rotation 85 Degrees           [facility] Adult PT Treatment/Exercise - 11/07/14 0001    Neck Exercises: Standing   Neck Retraction 10 reps;3 secs   Neck Exercises: Supine   Neck Retraction 5 reps;5 secs   Other Supine Exercise shoulder presses x 3 sec hold x 10    Other Supine Exercise nodding yes/no to promote improved cervical positioning and relaxation   Shoulder Exercises: Supine   Other Supine Exercises AAROM and stretching Lt shoulder  in flexion, abduction, ER, IR   Shoulder Exercises: Standing   Retraction --  foam roll along spine 10 reps 2 sets 5 sec hold   Other Standing Exercises passive stretch through pecs standing at wall with foam roll    Moist Heat Therapy   Number Minutes Moist Heat 5 Minutes   Moist Heat Location Cervical  prior to manual work   Cryotherapy   Number Minutes Cryotherapy 15 Minutes   Cryotherapy Location Cervical;Shoulder  Type of Cryotherapy Ice pack   Manual Therapy   Manual Therapy Soft tissue mobilization;Myofascial release;Scapular mobilization;Joint mobilization;Manual Traction   Manual therapy comments working through neck and Lt shoulder area with patient in supine position various techniques   Joint Mobilization gentle c-spine mobs PA note greatest tenderness and tightness C3-C4 Lt; mobs to Lt shoulder   Soft tissue mobilization muscular tightness Lt trap/leveator/scaleni/ SCM/pecs/periscapular tissues   Myofascial Release suboccipital release.    Scapular Mobilization working through medial scapular area   Manual Traction manual cervical traction/pull through long arm Lt UE   greater relief of symptoms with manual v mech tx          PT Education - 11/07/14 1448    Education provided Yes   Education  Details Discussed the fact that Kaiulani can't improve if she continues to irritate her neck and shoulder with her activities. Reinforced that posture and alignment influence neck and shoulder pathology. Added scap squeeze with foam roll along spine in standing.   Person(s) Educated Patient   Methods Explanation;Demonstration;Tactile cues;Verbal cues;Handout   Comprehension Verbalized understanding;Returned demonstration;Verbal cues required;Tactile cues required          PT Short Term Goals - 10/17/14 1118    PT SHORT TERM GOAL #1   Title I with advanced HEP ( 11/07/14)   Time 3   Period Weeks   Status New   PT SHORT TERM GOAL #2   Title demo increased cervical ROM to WNL to allow her to look for traffic (11/07/14)   Time 3   Period Weeks   Status New   PT SHORT TERM GOAL #3   Title increase Lt shoulder ROM to [facility] to allow her to reach over head without pain (11/07/14)   Time 3   Period Weeks   Status New   PT SHORT TERM GOAL #4   Title report pain decreased =/> 50%  ( 11/07/14)   Time 3   Period Weeks   Status New           PT Long Term Goals - 10/17/14 1120    PT LONG TERM GOAL #1   Title I with advanced HEP ( 11/28/14)   Time 6   Period Weeks   Status New   PT LONG TERM GOAL #2   Title demo Lt shoulder strength =/> 5-/5 to allow her to pick up and carry her grandchildren ( 11/28/14)   Time 6   Period Weeks   Status New   PT LONG TERM GOAL #3   Title Report pain decrease =/> 75% with daily activities (11/28/14)   Time 6   Period Weeks   Status New   PT LONG TERM GOAL #4   Title improve FOTO =/< 39% limited ( 11/28/14)   Time 6   Period Weeks   Status New           Plan - 11/07/14 1459    Clinical Impression Statement Patient continues to have increased pain and dysfunction - overworking with moving her mom. Activity level prevents patient from focusing on exercises and activities to improve neck and shoulder pathology.    Pt will benefit from skilled therapeutic  intervention in order to improve on the following deficits Postural dysfunction;Decreased strength;Pain;Impaired UE functional use;Decreased range of motion   Rehab Potential Excellent   Clinical Impairments Affecting Rehab Potential muscle guarding, poor posture and alignment   PT Frequency 2x / week   PT Duration 6 weeks   PT  Treatment/Interventions Moist Heat;Traction;Ultrasound;Cryotherapy;Electrical Stimulation;Passive range of motion;Patient/family education;Manual techniques;Therapeutic exercise   PT Next Visit Plan Progress as tolerated. Continue manual work as indicated.    PT Home Exercise Plan Work on suggestions discussed, work to improve cervical posture and alignment, HEP   Consulted and Agree with Plan of Care Patient        Problem List Patient Active Problem List   Diagnosis Date Noted  . Left cervical radiculopathy 10/04/2014  . BACK PAIN, LUMBAR, WITH RADICULOPATHY 10/23/2008  . MIGRAINE HEADACHE 09/11/2008  . Episodic mood disorder 08/26/2008  . AEROPHOBIA 08/26/2008  . ALLERGIC RHINITIS 08/26/2008    Calin Ellery Nilda Simmer, PT, MPH 11/07/2014, 3:07 PM  [facility] [place], [place], [address] Phone: [phone]   Fax:  [phone]     PHYSICAL THERAPY DISCHARGE SUMMARY  Visits from Start of Care: 4  Current functional level related to goals / functional outcomes: Patient continued to have pain and limitations. She was helping her mom move and doing a good bit of packing and physical activity. She achieved temporary improvement with therapy but changes did not last - likely due to continued physical activity.    Remaining deficits: Continued pain and physical limitations.   Education / Equipment: HEP  Plan: Patient agrees to discharge.  Patient goals were not met. Patient is being discharged due to the patient's request.  ?????   Patient did not wish to continue PT due to other family  medical problems.   Reighn Kaplan P. Helene Kelp, PT, MPH 12/20/14 1:31pm

## 2014-11-08 ENCOUNTER — Other Ambulatory Visit: Payer: Self-pay | Admitting: *Deleted

## 2014-11-08 DIAGNOSIS — M5412 Radiculopathy, cervical region: Secondary | ICD-10-CM

## 2014-11-08 MED ORDER — CYCLOBENZAPRINE HCL 10 MG PO TABS: ORAL_TABLET | ORAL | Status: DC

## 2014-11-08 NOTE — Progress Notes (Signed)
Resent to pharm with sig clarification.

## 2014-11-18 ENCOUNTER — Encounter: Payer: BLUE CROSS/BLUE SHIELD | Admitting: Physical Therapy

## 2014-11-21 ENCOUNTER — Encounter: Payer: BLUE CROSS/BLUE SHIELD | Admitting: Rehabilitative and Restorative Service Providers"

## 2014-11-26 ENCOUNTER — Encounter: Payer: Self-pay | Admitting: Rehabilitative and Restorative Service Providers"

## 2014-11-28 ENCOUNTER — Encounter: Payer: BLUE CROSS/BLUE SHIELD | Admitting: Physical Therapy

## 2014-11-29 ENCOUNTER — Encounter: Payer: BLUE CROSS/BLUE SHIELD | Admitting: Physical Therapy

## 2014-12-03 ENCOUNTER — Ambulatory Visit: Payer: BLUE CROSS/BLUE SHIELD | Admitting: Sports Medicine

## 2014-12-03 ENCOUNTER — Encounter: Payer: BLUE CROSS/BLUE SHIELD | Admitting: Physical Therapy

## 2014-12-05 ENCOUNTER — Ambulatory Visit: Payer: BLUE CROSS/BLUE SHIELD | Admitting: Sports Medicine

## 2014-12-05 ENCOUNTER — Encounter: Payer: BLUE CROSS/BLUE SHIELD | Admitting: Physical Therapy

## 2014-12-12 ENCOUNTER — Ambulatory Visit: Payer: BLUE CROSS/BLUE SHIELD | Admitting: Sports Medicine

## 2014-12-17 ENCOUNTER — Encounter: Payer: BLUE CROSS/BLUE SHIELD | Admitting: Family Medicine

## 2014-12-19 ENCOUNTER — Ambulatory Visit: Payer: BLUE CROSS/BLUE SHIELD | Admitting: Sports Medicine

## 2014-12-24 ENCOUNTER — Ambulatory Visit: Payer: BLUE CROSS/BLUE SHIELD | Admitting: Sports Medicine

## 2015-01-02 ENCOUNTER — Encounter: Payer: Self-pay | Admitting: Sports Medicine

## 2015-01-02 ENCOUNTER — Ambulatory Visit (INDEPENDENT_AMBULATORY_CARE_PROVIDER_SITE_OTHER): Payer: BLUE CROSS/BLUE SHIELD | Admitting: Sports Medicine

## 2015-01-02 VITALS — BP 115/77 | HR 66 | Wt 193.0 lb

## 2015-01-02 DIAGNOSIS — M5412 Radiculopathy, cervical region: Secondary | ICD-10-CM | POA: Diagnosis not present

## 2015-01-02 NOTE — Assessment & Plan Note (Signed)
Completed a resolved after physical therapy. She does have an MRI already, she has a disc, and if persistence of symptoms or return of symptoms we will do another course of physical therapy beforeconsidering a left-sided C7-T1 interlaminar epidural Return as needed.

## 2015-01-02 NOTE — Progress Notes (Signed)
  Subjective:    CC:  Follow-up  HPI: This is a pleasant 55 year old febrile, she returns to discuss left sided cervical radiculopathy, she already had an MRI done, we put her through formal physical therapy and she returns completely pain-free, with regards to both axial pain and radiculopathy. Happy with results so far.  Past medical history, Surgical history, Family history not pertinant except as noted below, Social history, Allergies, and medications have been entered into the medical record, reviewed, and no changes needed.   Review of Systems: No fevers, chills, night sweats, weight loss, chest pain, or shortness of breath.   Objective:    General: Well Developed, well nourished, and in no acute distress.  Neuro: Alert and oriented x3, extra-ocular muscles intact, sensation grossly intact.  HEENT: Normocephalic, atraumatic, pupils equal round reactive to light, neck supple, no masses, no lymphadenopathy, thyroid nonpalpable.  Skin: Warm and dry, no rashes. Cardiac: Regular rate and rhythm, no murmurs rubs or gallops, no lower extremity edema.  Respiratory: Clear to auscultation bilaterally. Not using accessory muscles, speaking in full sentences. Neck: Negative spurling's Full neck range of motion Grip strength and sensation normal in bilateral hands Strength good C4 to T1 distribution No sensory change to C4 to T1 Reflexes normal  Impression and Recommendations:

## 2015-01-16 ENCOUNTER — Encounter: Payer: BLUE CROSS/BLUE SHIELD | Admitting: Family Medicine

## 2015-02-07 ENCOUNTER — Encounter: Payer: BLUE CROSS/BLUE SHIELD | Admitting: Family Medicine

## 2015-05-07 ENCOUNTER — Encounter: Payer: BLUE CROSS/BLUE SHIELD | Admitting: Family Medicine

## 2015-06-02 ENCOUNTER — Encounter: Payer: BLUE CROSS/BLUE SHIELD | Admitting: Family Medicine

## 2015-08-13 DIAGNOSIS — G4700 Insomnia, unspecified: Secondary | ICD-10-CM | POA: Insufficient documentation

## 2015-08-13 DIAGNOSIS — G43109 Migraine with aura, not intractable, without status migrainosus: Secondary | ICD-10-CM | POA: Diagnosis not present

## 2015-08-13 DIAGNOSIS — G43709 Chronic migraine without aura, not intractable, without status migrainosus: Secondary | ICD-10-CM | POA: Insufficient documentation

## 2016-03-16 ENCOUNTER — Ambulatory Visit (INDEPENDENT_AMBULATORY_CARE_PROVIDER_SITE_OTHER): Payer: BLUE CROSS/BLUE SHIELD | Admitting: Family Medicine

## 2016-03-16 ENCOUNTER — Encounter: Payer: Self-pay | Admitting: Family Medicine

## 2016-03-16 VITALS — BP 123/75 | HR 63 | Temp 98.5°F | Wt 197.0 lb

## 2016-03-16 DIAGNOSIS — Z1231 Encounter for screening mammogram for malignant neoplasm of breast: Secondary | ICD-10-CM

## 2016-03-16 DIAGNOSIS — R69 Illness, unspecified: Secondary | ICD-10-CM

## 2016-03-16 DIAGNOSIS — J029 Acute pharyngitis, unspecified: Secondary | ICD-10-CM

## 2016-03-16 DIAGNOSIS — Z1211 Encounter for screening for malignant neoplasm of colon: Secondary | ICD-10-CM

## 2016-03-16 DIAGNOSIS — R509 Fever, unspecified: Secondary | ICD-10-CM | POA: Diagnosis not present

## 2016-03-16 DIAGNOSIS — J111 Influenza due to unidentified influenza virus with other respiratory manifestations: Secondary | ICD-10-CM

## 2016-03-16 LAB — POCT INFLUENZA A/B
Influenza A, POC: NEGATIVE
Influenza B, POC: NEGATIVE

## 2016-03-16 LAB — POCT RAPID STREP A (OFFICE): RAPID STREP A SCREEN: NEGATIVE

## 2016-03-16 MED ORDER — HYDROCODONE-HOMATROPINE 5-1.5 MG/5ML PO SYRP: 5.0000 mL | ORAL_SOLUTION | Freq: Every evening | ORAL | 0 refills | Status: DC | PRN

## 2016-03-16 NOTE — Progress Notes (Signed)
Subjective:    CC: URI  HPI:  Cough and congestion x 1 week.  Over weekend had fever up to 100.  Cough has been productive with green sputum. + ST, + bodyaches.  Using Advil. Last took at 5 AM.  + nasal congestion with bilat facial pain and pressure.  She says her chest is starting to get sore because she's coughing so much.  Past medical history, Surgical history, Family history not pertinant except as noted below, Social history, Allergies, and medications have been entered into the medical record, reviewed, and corrections made.   Review of Systems: No fevers, chills, night sweats, weight loss, chest pain, or shortness of breath.   Objective:    General: Well Developed, well nourished, and in no acute distress.  Neuro: Alert and oriented x3, extra-ocular muscles intact, sensation grossly intact.  HEENT: Normocephalic, atraumatic, OP is clear, EOMi PEERLA, no sig cervical LNs but tender on exam.   Skin: Warm and dry, no rashes. Cardiac: Regular rate and rhythm, no murmurs rubs or gallops, no lower extremity edema.  Respiratory: Clear to auscultation bilaterally. Not using accessory muscles, speaking in full sentences.   Impression and Recommendations:   Flu -like illness - Likely viral. If not better by the end of the week though did encourage her to call back and we'll treat her for sinusitis at that point if she's not improving. Encouraged hydration. Can use Advil and Aleve and Tylenol as needed for fever and body aches. Given some hydrocodone cough syrup to use just at bedtime since the cough is keeping her up at wake.  Discussed need for mammogram and colon cancer screening. She was okay with us ordering both. She did decline flu vaccine today but says she will try to get it at her pharmacy in the next few weeks.

## 2016-03-16 NOTE — Patient Instructions (Signed)
Call back later this week if you are not feeling better.

## 2016-03-18 ENCOUNTER — Telehealth: Payer: Self-pay

## 2016-03-18 NOTE — Telephone Encounter (Signed)
Pt lvm stating that she is not feeling any better and that Dr. Linford ArnoldMetheney would send in an Abx for her. Marland Kitchen.Jessica Alexander.Jessica Alexander, Jessica Alexander

## 2016-03-18 NOTE — Telephone Encounter (Signed)
Gunnar Fusiaula called and left a message stating she was advised to call back if not better. She doesn't feel any better. Please advise.

## 2016-03-19 MED ORDER — AMOXICILLIN-POT CLAVULANATE 875-125 MG PO TABS: 1.0000 | ORAL_TABLET | Freq: Two times a day (BID) | ORAL | 0 refills | Status: DC

## 2016-03-19 MED ORDER — AZITHROMYCIN 250 MG PO TABS: ORAL_TABLET | ORAL | 0 refills | Status: DC

## 2016-03-19 NOTE — Telephone Encounter (Signed)
Left VM for Pt advising of new Rx. Callback information provided for any questions.

## 2016-03-19 NOTE — Telephone Encounter (Signed)
Called pharmacy, cancelled azithromycin and advised to fill Augmentin. Verbalized understanding.

## 2016-03-19 NOTE — Telephone Encounter (Signed)
Prescription sent to pharmacy. Please let her know. Please call the pharmacy and cancel the azithromycin. After I sent it I realize that she's allergic to clarithromycin which is similar. New prescription sent for Augmentin. Please make sure they feel that one.

## 2016-04-23 ENCOUNTER — Encounter: Payer: Self-pay | Admitting: Sports Medicine

## 2016-04-23 ENCOUNTER — Ambulatory Visit (INDEPENDENT_AMBULATORY_CARE_PROVIDER_SITE_OTHER): Payer: BLUE CROSS/BLUE SHIELD | Admitting: Sports Medicine

## 2016-04-23 ENCOUNTER — Ambulatory Visit: Payer: BLUE CROSS/BLUE SHIELD

## 2016-04-23 DIAGNOSIS — J0101 Acute recurrent maxillary sinusitis: Secondary | ICD-10-CM | POA: Insufficient documentation

## 2016-04-23 MED ORDER — PREDNISONE 50 MG PO TABS: ORAL_TABLET | ORAL | 0 refills | Status: DC

## 2016-04-23 MED ORDER — CEFTRIAXONE SODIUM 1 G IJ SOLR
1.0000 g | Freq: Once | INTRAMUSCULAR | Status: AC
  Administered 2016-04-23: 1 g via INTRAMUSCULAR

## 2016-04-23 MED ORDER — HYDROCOD POLST-CPM POLST ER 10-8 MG/5ML PO SUER: 5.0000 mL | Freq: Two times a day (BID) | ORAL | 0 refills | Status: DC | PRN

## 2016-04-23 MED ORDER — THERAFLU SEVERE COLD/CGH DAY 20-10-650 MG PO PACK: PACK | ORAL | 0 refills | Status: DC

## 2016-04-23 MED ORDER — LEVOFLOXACIN 750 MG PO TABS: 750.0000 mg | ORAL_TABLET | Freq: Every day | ORAL | 0 refills | Status: DC

## 2016-04-23 NOTE — Progress Notes (Signed)
  Subjective:    CC: Feeling sick  HPI: This is a pleasant 55 year old female, she was seen by Dr. Linford ArnoldMetheney, treated for a sinusitis with Augmentin, unfortunately symptoms did not improve. She has persistent pain localized on both maxillary sinuses with a severe cough that keeps her up at night, minimally productive. No constitutional symptoms, GI symptoms, no rashes.  Past medical history:  Negative.  See flowsheet/record as well for more information.  Surgical history: Negative.  See flowsheet/record as well for more information.  Family history: Negative.  See flowsheet/record as well for more information.  Social history: Negative.  See flowsheet/record as well for more information.  Allergies, and medications have been entered into the medical record, reviewed, and no changes needed.   Review of Systems: No fevers, chills, night sweats, weight loss, chest pain, or shortness of breath.   Objective:    General: Well Developed, well nourished, and in no acute distress.  Neuro: Alert and oriented x3, extra-ocular muscles intact, sensation grossly intact.  HEENT: Normocephalic, atraumatic, pupils equal round reactive to light, neck supple, no masses, no lymphadenopathy, thyroid nonpalpable. Oropharynx, nasopharynx, ear canals and remarkable. Tender to palpation over the sinuses. Skin: Warm and dry, no rashes. Cardiac: Regular rate and rhythm, no murmurs rubs or gallops, no lower extremity edema.  Respiratory: Clear to auscultation bilaterally. Not using accessory muscles, speaking in full sentences.  Impression and Recommendations:    Acute recurrent maxillary sinusitis Acute maxillary sinusitis, as well as bronchitis. Allergic to macrolides and failed Augmentin, adding levofloxacin. Prednisone, a gram of Rocephin given in the office, Tussionex syrup. Patient can use over-the-counter TheraFlu, return to see us as needed.  I spent 25 minutes with this patient, greater than 50% was  face-to-face time counseling regarding the above diagnoses

## 2016-04-23 NOTE — Assessment & Plan Note (Signed)
Acute maxillary sinusitis, as well as bronchitis. Allergic to macrolides and failed Augmentin, adding levofloxacin. Prednisone, a gram of Rocephin given in the office, Tussionex syrup. Patient can use over-the-counter TheraFlu, return to see us as needed.

## 2016-05-14 ENCOUNTER — Ambulatory Visit: Payer: BLUE CROSS/BLUE SHIELD

## 2016-05-21 ENCOUNTER — Ambulatory Visit: Payer: BLUE CROSS/BLUE SHIELD

## 2016-05-25 ENCOUNTER — Ambulatory Visit: Payer: BLUE CROSS/BLUE SHIELD

## 2016-05-28 ENCOUNTER — Ambulatory Visit: Payer: BLUE CROSS/BLUE SHIELD

## 2016-06-04 ENCOUNTER — Ambulatory Visit: Payer: BLUE CROSS/BLUE SHIELD

## 2016-06-25 ENCOUNTER — Ambulatory Visit: Payer: BLUE CROSS/BLUE SHIELD

## 2016-06-30 ENCOUNTER — Ambulatory Visit (INDEPENDENT_AMBULATORY_CARE_PROVIDER_SITE_OTHER): Payer: BLUE CROSS/BLUE SHIELD

## 2016-06-30 DIAGNOSIS — Z1231 Encounter for screening mammogram for malignant neoplasm of breast: Secondary | ICD-10-CM | POA: Diagnosis not present

## 2016-11-26 ENCOUNTER — Telehealth: Payer: Self-pay

## 2016-11-26 ENCOUNTER — Encounter: Payer: Self-pay | Admitting: Emergency Medicine

## 2016-11-26 ENCOUNTER — Emergency Department (INDEPENDENT_AMBULATORY_CARE_PROVIDER_SITE_OTHER)
Admission: EM | Admit: 2016-11-26 | Discharge: 2016-11-26 | Disposition: A | Discharge status: Home / Self Care | Payer: BLUE CROSS/BLUE SHIELD | Source: Home / Self Care | Attending: Family Medicine | Admitting: Family Medicine

## 2016-11-26 DIAGNOSIS — R5383 Other fatigue: Secondary | ICD-10-CM

## 2016-11-26 DIAGNOSIS — R079 Chest pain, unspecified: Secondary | ICD-10-CM

## 2016-11-26 DIAGNOSIS — R110 Nausea: Secondary | ICD-10-CM

## 2016-11-26 MED ORDER — ASPIRIN 81 MG PO CHEW
162.0000 mg | CHEWABLE_TABLET | Freq: Once | ORAL | Status: AC
  Administered 2016-11-26: 162 mg via ORAL

## 2016-11-26 MED ORDER — ONDANSETRON 4 MG PO TBDP
4.0000 mg | ORAL_TABLET | Freq: Once | ORAL | Status: AC
  Administered 2016-11-26: 4 mg via ORAL

## 2016-11-26 NOTE — Telephone Encounter (Signed)
Pt feeling some better.  Will follow up with ER if worsens overnight.

## 2016-11-26 NOTE — ED Triage Notes (Signed)
Chest pain since this morning, patient reports nausea and unable to eat anything, reports fatigue

## 2016-11-26 NOTE — ED Provider Notes (Signed)
CSN: [id number]     Arrival date & time 11/26/16  1644 History   First MD Initiated Contact with Patient 11/26/16 1649     Chief Complaint  Patient presents with  . Chest Pain   (Consider location/radiation/quality/duration/timing/severity/associated sxs/prior Treatment) HPI  Jessica Alexander is a 56 y.o. female presenting to UC with c/o chest pain that started this morning while she was making breakfast, has been constant since.  Chest pain is on Left side of chest in Left upper abdomen into Left breast, radiates down Left upper arm with mild numbness in Left arm.  Pain is 9/10, aching, Aleve taken about 4 hours PTA with mild relief. Associated nausea, fatigue, and upper back pain.  Pt notes she has been under a lot of stress recently as her mother has cancer.  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  She did have chest pain a few years ago and went to the hospital but everything came back normal.  Denies hx of anxiety or panic attacks. Denies hx of high cholesterol or high blood pressure. No hx of blood clots, or leg pain and welling. No recent travel. No cough, congestion, vomiting or diarrhea.    Past Medical History:  Diagnosis Date  . Endometriosis   . Pneumonia   . WPW syndrome    Past Surgical History:  Procedure Laterality Date  . FOOT SURGERY  06/2010  . uterine tac  2001  . VAGINAL HYSTERECTOMY  2003   Family History  Problem Relation Age of Onset  . Hypertension Mother   . Stroke Mother        x 2  . Hypertension Father   . Alzheimer's disease Father    Social History  Substance Use Topics  . Smoking status: Never Smoker  . Smokeless tobacco: Never Used  . Alcohol use Yes     Comment: rarely   OB History    Gravida Para Term Preterm AB Living   2 2 2     2    SAB TAB Ectopic Multiple Live Births                 Review of Systems  Constitutional: Positive for fatigue. Negative for chills  and fever.  HENT: Negative for congestion, ear pain, sore throat, trouble swallowing and voice change.   Respiratory: Positive for chest tightness. Negative for cough and shortness of breath.   Cardiovascular: Positive for chest pain. Negative for palpitations.  Gastrointestinal: Positive for nausea. Negative for abdominal pain, diarrhea and vomiting.  Musculoskeletal: Negative for arthralgias, back pain and myalgias.  Skin: Negative for rash.  Neurological: Positive for numbness and headaches. Negative for weakness.    Allergies  Sumatriptan and Clarithromycin  Home Medications   Prior to Admission medications   Medication Sig Start Date End Date Taking? Authorizing Provider  AMBULATORY NON FORMULARY MEDICATION Take by mouth daily. Medication Name: Veema liquid vitamin    [provider]  chlorpheniramine-HYDROcodone (TUSSIONEX) 10-8 MG/5ML SUER Take 5 mLs by mouth every 12 (twelve) hours as needed for cough (cough, will cause drowsiness.). 04/23/16   Monica Bectonhekkekandam, Thomas J, MD  divalproex (DEPAKOTE ER) 500 MG 24 hr tablet Take 500 mg by mouth daily. Take 1 tablets by mouth.    [provider]  fexofenadine (ALLEGRA) 180 MG tablet Take 180 mg by mouth daily.    [provider]  levofloxacin (LEVAQUIN) 750 MG tablet  Take 1 tablet (750 mg total) by mouth daily. 04/23/16   Monica Bectonhekkekandam, Thomas J, MD  predniSONE (DELTASONE) 50 MG tablet One tab PO daily for 5 days. 04/23/16   Monica Bectonhekkekandam, Thomas J, MD  temazepam (RESTORIL) 30 MG capsule TAKE ONE CAPSULE BY MOUTH NIGHTLY AT BEDTIME AS NEEDED 11/05/14   Agapito GamesMetheney, Catherine D, MD  [facility] SEVERE C[facility]/CGH DAY 20-10-650 MG PACK One dose every 6-8 hours 04/23/16   Monica Bectonhekkekandam, Thomas J, MD   Meds Ordered and Administered this Visit   Medications  aspirin chewable tablet 162 mg (162 mg Oral Given 11/26/16 1716)  ondansetron (ZOFRAN-ODT) disintegrating tablet 4 mg (4 mg Oral Given 11/26/16 1716)    BP 114/75 (BP  Location: Left Arm)   Pulse 75   Temp 98 F (36.7 C) (Oral)   Ht 5\' 9"  (1.753 m)   Wt 180 lb (81.6 kg)   SpO2 100%   BMI 26.58 kg/m  No data found.   Physical Exam  Constitutional: She is oriented to person, place, and time. She appears well-developed and well-nourished. No distress.  HENT:  Head: Normocephalic and atraumatic.  Mouth/Throat: Oropharynx is clear and moist.  Eyes: EOM are normal.  Neck: Normal range of motion. Neck supple.  Cardiovascular: Normal rate and regular rhythm.   Pulmonary/Chest: Effort normal and breath sounds normal. No respiratory distress. She has no wheezes. She has no rales. She exhibits no tenderness.  Musculoskeletal: Normal range of motion. She exhibits no edema or tenderness.  Neurological: She is alert and oriented to person, place, and time.  Skin: Skin is warm and dry. She is not diaphoretic.  Psychiatric: She has a normal mood and affect. Her behavior is normal.  Nursing note and vitals reviewed.   Urgent Care Course     Procedures (including critical care time)  Labs Review Labs Reviewed - No data to display  Imaging Review No results found.  Date/Time:11/26/2016   16:56:47 Ventricular Rate: 65 PR Interval: 152 QRS Duration: 106 QT Interval: 406 QTC Calculation: 422 P-R-T axes: 45  29   27 Text Interpretation: Normal sinus rhythm. Incomplete Right bundle branch block  Reviewed EKG with Dr. Linford ArnoldMetheney, pt's PCP. Unremarkable.    MDM   1. Nonspecific chest pain   2. Nausea without vomiting   3. Fatigue, unspecified type    Constant CP with fatigue, nausea and arm numbness since this morning.   Nothing makes pain better or worse. Pt appears well. NAD Vitals: WNL Reviewed EKG with Dr. Linford ArnoldMetheney, unremarkable  Zofran 4mg  and 162mg  aspirin given in UC. Advised pt hx and exam c/w atypical chest pain. Could be brought on by reported stress at home with sick mother. Encouraged to f/u with PCP next week if needed,  however, do not hesitate to call 911 or go to closest emergency department this evening or this weekend if symptoms worsening.  Pt and daughter verbalized understanding and agreement with tx plan.     Lurene Shadowhelps, Aletta Edmunds O, [place]-C 11/26/16 1805

## 2016-11-26 NOTE — Discharge Instructions (Signed)
°  Your exam and EKG in urgent care appear to be okay at this time.   Do not hesitate to go to the emergency department or call 911 this evening or this weekend if symptoms change or worsen.

## 2016-11-30 ENCOUNTER — Ambulatory Visit (INDEPENDENT_AMBULATORY_CARE_PROVIDER_SITE_OTHER): Payer: BLUE CROSS/BLUE SHIELD | Admitting: Osteopathic Medicine

## 2016-11-30 ENCOUNTER — Encounter: Payer: Self-pay | Admitting: Osteopathic Medicine

## 2016-11-30 VITALS — BP 115/80 | HR 71 | Ht 69.0 in | Wt 193.0 lb

## 2016-11-30 DIAGNOSIS — R0602 Shortness of breath: Secondary | ICD-10-CM | POA: Diagnosis not present

## 2016-11-30 DIAGNOSIS — F419 Anxiety disorder, unspecified: Secondary | ICD-10-CM | POA: Diagnosis not present

## 2016-11-30 DIAGNOSIS — R0789 Other chest pain: Secondary | ICD-10-CM | POA: Diagnosis not present

## 2016-11-30 LAB — TROPONIN I

## 2016-11-30 LAB — D-DIMER, QUANTITATIVE (NOT AT ARMC): D DIMER QUANT: 0.31 ug{FEU}/mL (ref <0.50)

## 2016-11-30 NOTE — Patient Instructions (Signed)
Plan:  STAT labs for Troponin (heart) and D-Dimer (blood clot)   If Troponin is high, you'll get a call to GO TO THE HOSPITAL  If D-Dimer is high, we'll schedule a CT of the chest for tomorrow to evaluate for possible blood clot of the lungs   Other labs to check anemia, thyroid, electrolytes, others  If all labs/tests are normal, will send medication for anxiety

## 2016-11-30 NOTE — Progress Notes (Signed)
HPI: Jessica Alexander is a 56 y.o. female  who [facility], 11/[facility]/16,  for chief complaint of:  Chief Complaint  Patient presents with  . Shortness of Breath     Seen and treated in urgent care 4 days ago for atypical/nonspecific chest pain starting that morning will she was making breakfast, associated with left-sided chest pain left upper abdominal pain down into the left upper arm with mild numbness and left arm. No concerns on EKG and it appeared stable from previous exams. Patient was treated with Zofran and aspirin. History of WPW and incomplete RBBB.   EKG from urgent care personally reviewed, no ST/T changes to indicate acute ischemia/infarction. EKG report from [facility] 12 2013 was read as normal.  Nodes she has been dealing with a good deal of stress recently, caring for her mother with cancer.   Past medical history, surgical history, social history and family history reviewed.  Patient Active Problem List   Diagnosis Date Noted  . Acute recurrent maxillary sinusitis 04/23/2016  . Left cervical radiculopathy 10/04/2014  . BACK PAIN, LUMBAR, WITH RADICULOPATHY 10/23/2008  . MIGRAINE HEADACHE 09/11/2008  . Episodic mood disorder (HCC) 08/26/2008  . AEROPHOBIA 08/26/2008  . ALLERGIC RHINITIS 08/26/2008    Current medication list and allergy/intolerance information reviewed.   Current Outpatient Prescriptions on File Prior to Visit  Medication Sig Dispense Refill  . AMBULATORY NON FORMULARY MEDICATION Take by mouth daily. Medication Name: Veema liquid vitamin    . chlorpheniramine-HYDROcodone (TUSSIONEX) 10-8 MG/5ML SUER Take 5 mLs by mouth every 12 (twelve) hours as needed for cough (cough, will cause drowsiness.). 120 mL 0  . divalproex (DEPAKOTE ER) 500 MG 24 hr tablet Take 500 mg by mouth daily. Take 1 tablets by mouth.    . fexofenadine (ALLEGRA) 180 MG tablet Take 180 mg by mouth daily.    Marland Kitchen. levofloxacin (LEVAQUIN)  750 MG tablet Take 1 tablet (750 mg total) by mouth daily. 7 tablet 0  . predniSONE (DELTASONE) 50 MG tablet One tab PO daily for 5 days. 5 tablet 0  . temazepam (RESTORIL) 30 MG capsule TAKE ONE CAPSULE BY MOUTH NIGHTLY AT BEDTIME AS NEEDED 30 capsule 5  . THERAFLU SEVERE COLD/CGH DAY 20-10-650 MG PACK One dose every 6-8 hours 100 each 0   No current facility-administered medications on file prior to visit.    Allergies  Allergen Reactions  . Sumatriptan     REACTION: Passed out  . Clarithromycin Rash    REACTION: Rash      Review of Systems:  Constitutional: No recent illness  HEENT: No  headache, no vision change  Cardiac: +chest pain, +pressure, No palpitations  Respiratory:  No  shortness of breath. No  Cough  Gastrointestinal: No  abdominal pain  Musculoskeletal: No new myalgia/arthralgia  Skin: No  Rash  Hem/Onc: No  easy bruising/bleeding, No  abnormal lumps/bumps  Neurologic: No  weakness, No  Dizziness  Psychiatric: No  concerns with depression, No  concerns with anxiety  Exam:  BP 115/80   Pulse 71   Ht 5\' 9"  (1.753 m)   Wt 193 lb (87.5 kg)   SpO2 99%   BMI 28.50 kg/m   Constitutional: VS see above. General Appearance: alert, well-developed, well-nourished, NAD  Eyes: Normal lids and conjunctive, non-icteric sclera  Ears, Nose, Mouth, Throat: MMM, Normal external inspection ears/nares/mouth/lips/gums.  Neck: No masses, trachea midline.   Respiratory: Normal respiratory effort. no wheeze, no rhonchi, no rales  Cardiovascular: S1/S2 normal, no murmur, no rub/gallop auscultated. RRR.   Musculoskeletal: Gait normal. Symmetric and independent movement of all extremities  Neurological: Normal balance/coordination. No tremor.  Skin: warm, dry, intact.   Psychiatric: Normal judgment/insight. Normal mood and affect. Oriented x3.    EKG interpretation: Rate: 70 Rhythm: sinus No ST/T changes concerning for acute ischemia/infarct     ASSESSMENT/PLAN: The primary encounter diagnosis was Shortness of breath. A diagnosis of Chest pressure was also pertinent to this visit.  Stable symptoms with strong history of anxiety but persistence of chest tightness and shortness of breath we should rule out cardiopulmonary issues. Okay to consider benzodiazepine if workup is negative, follow-up with PCP  Orders Placed This Encounter  Procedures  . D-dimer, quantitative (not [facility])  . Troponin I  . CBC  . COMPLETE METABOLIC PANEL WITH GFR  . TSH  . Magnesium     Patient Instructions  Plan:  STAT labs for Troponin (heart) and D-Dimer (blood clot)   If Troponin is high, you'll get a call to GO TO THE HOSPITAL  If D-Dimer is high, we'll schedule a CT of the chest for tomorrow to evaluate for possible blood clot of the lungs   Other labs to check anemia, thyroid, electrolytes, others  If all labs/tests are normal, will send medication for anxiety     Follow-up plan: Return if symptoms worsen or fail to improve, +/- further discussion of anxiety with PCP if this continues .  Visit summary with medication list and pertinent instructions was printed for patient to review, alert us if any changes needed. All questions at time of visit were answered - patient instructed to contact office with any additional concerns. ER/RTC precautions were reviewed with the patient and understanding verbalized.

## 2016-12-01 LAB — CBC
HEMATOCRIT: 38.4 % (ref 35.0–45.0)
HEMOGLOBIN: 12.6 g/dL (ref 11.7–15.5)
MCH: 31.1 pg (ref 27.0–33.0)
MCHC: 32.8 g/dL (ref 32.0–36.0)
MCV: 94.8 fL (ref 80.0–100.0)
MPV: 10.7 fL (ref 7.5–12.5)
Platelets: 305 10*3/uL (ref 140–400)
RBC: 4.05 MIL/uL (ref 3.80–5.10)
RDW: 13.2 % (ref 11.0–15.0)
WBC: 6.3 10*3/uL (ref 3.8–10.8)

## 2016-12-01 LAB — COMPLETE METABOLIC PANEL WITH GFR
ALT: 18 U/L (ref 6–29)
AST: 20 U/L (ref 10–35)
Albumin: 4 g/dL (ref 3.6–5.1)
Alkaline Phosphatase: 82 U/L (ref 33–130)
BILIRUBIN TOTAL: 0.3 mg/dL (ref 0.2–1.2)
BUN: 19 mg/dL (ref 7–25)
CALCIUM: 9.1 mg/dL (ref 8.6–10.4)
CHLORIDE: 103 mmol/L (ref 98–110)
CO2: 25 mmol/L (ref 20–31)
CREATININE: 0.86 mg/dL (ref 0.50–1.05)
GFR, EST AFRICAN AMERICAN: 89 mL/min (ref >=60)
GFR, Est Non African American: 77 mL/min (ref >=60)
Glucose, Bld: 80 mg/dL (ref 65–99)
Potassium: 4.7 mmol/L (ref 3.5–5.3)
Sodium: 139 mmol/L (ref 135–146)
Total Protein: 6.5 g/dL (ref 6.1–8.1)

## 2016-12-01 LAB — MAGNESIUM: MAGNESIUM: 2 mg/dL (ref 1.5–2.5)

## 2016-12-01 LAB — TSH: TSH: 5.67 m[IU]/L — AB

## 2016-12-01 MED ORDER — CLONAZEPAM 0.5 MG PO TABS: 0.2500 mg | ORAL_TABLET | Freq: Every day | ORAL | 0 refills | Status: DC | PRN

## 2016-12-01 NOTE — Addendum Note (Signed)
Addended by: Deirdre PippinsALEXANDER, Virlan Kempker M on: 12/01/2016 11:02 AM   Modules accepted: Orders

## 2017-03-03 DIAGNOSIS — H52203 Unspecified astigmatism, bilateral: Secondary | ICD-10-CM | POA: Diagnosis not present

## 2017-03-03 DIAGNOSIS — H524 Presbyopia: Secondary | ICD-10-CM | POA: Diagnosis not present

## 2017-06-08 DIAGNOSIS — G43109 Migraine with aura, not intractable, without status migrainosus: Secondary | ICD-10-CM | POA: Diagnosis not present

## 2017-06-08 DIAGNOSIS — G43709 Chronic migraine without aura, not intractable, without status migrainosus: Secondary | ICD-10-CM | POA: Diagnosis not present

## 2017-10-12 ENCOUNTER — Encounter: Payer: Self-pay | Admitting: Family Medicine

## 2017-10-12 ENCOUNTER — Ambulatory Visit: Payer: BLUE CROSS/BLUE SHIELD | Admitting: Family Medicine

## 2017-10-12 VITALS — BP 121/76 | HR 77 | Ht 69.0 in | Wt 196.0 lb

## 2017-10-12 DIAGNOSIS — S39012A Strain of muscle, fascia and tendon of lower back, initial encounter: Secondary | ICD-10-CM | POA: Diagnosis not present

## 2017-10-12 DIAGNOSIS — M25512 Pain in left shoulder: Secondary | ICD-10-CM

## 2017-10-12 MED ORDER — CYCLOBENZAPRINE HCL 10 MG PO TABS: 5.0000 mg | ORAL_TABLET | Freq: Three times a day (TID) | ORAL | 0 refills | Status: DC | PRN

## 2017-10-12 NOTE — Progress Notes (Signed)
Jessica Alexander is a 57 y.o. female who presents to [facility] today for back pain and left shoulder pain.  Gunnar Fusiaula was in her normal state of health over the weekend.  She was bending down to pick an object up and twisted.  She felt a pulling sensation in her low back.  Since then she has had significant pain in her left low back.  She denies any significant radiating pain weakness or numbness or bowel bladder dysfunction.  She has tried heating pad NSAIDs ice packs and relative rest which have helped only a little.  Additionally around the same time she developed pain in her left lateral upper arm and shoulder.  She notes pain is worse with overhead motion and reaching back.  She denies any radiating pain weakness or numbness.  She is tried the above medications as well which helped only a little.  She denies any injury.    She notes that both her back pain in her shoulder pain are interfering with her ability to take care of herself at home and complete normal activities of daily living such as cooking cleaning picking and pushing objects around her house.  Additionally she has trouble caring for grandchildren.    ROS:  As above  Exam:  BP 121/76   Pulse 77   Ht 5\' 9"  (1.753 m)   Wt 196 lb (88.9 kg)   BMI 28.94 kg/m  General: Well Developed, well nourished, and in no acute distress.  Neuro/Psych: Alert and oriented x3, extra-ocular muscles intact, able to move all 4 extremities, sensation grossly intact. Skin: Warm and dry, no rashes noted.  Respiratory: Not using accessory muscles, speaking in full sentences, trachea midline.  Cardiovascular: Pulses palpable, no extremity edema. Abdomen: Does not appear distended. MSK:  C-spine: Nontender to spinal midline. Decreased cervical spine range of motion. Left shoulder normal-appearing nontender. Range of motion abduction 110 degrees, external rotation 30 degrees been in the neutral  position, internal rotation to iliac crest Positive Hawkins and Neer's test. Positive empty can test. Strength decreased and painful to internal rotation and abduction normal external rotation  Contralateral right shoulder nontender normal range of motion negative testing normal strength.  L-spine nontender to midline.  Tender to palpation left lumbar paraspinal muscle group.  Lumbar motion intact but painful with flexion Lower extreme the strength sensation reflexes are equal normal throughout. Mild antalgic gait present.   Procedure: Real-time Ultrasound Guided Injection of left subacromial injection  Device: GE Logiq E   Images permanently stored and available for review in the ultrasound unit. Verbal informed consent obtained.  Discussed risks and benefits of procedure. Warned about infection bleeding damage to structures skin hypopigmentation and fat atrophy among others. Patient expresses understanding and agreement Time-out conducted.   Noted no overlying erythema, induration, or other signs of local infection.   Skin prepped in a sterile fashion.   Local anesthesia: Topical Ethyl chloride.   With sterile technique and under real time ultrasound guidance:  40mg  kenalog and 2ml marcaine injected easily.   Completed without difficulty   Pain immediately resolved suggesting accurate placement of the medication.   Advised to call if fevers/chills, erythema, induration, drainage, or persistent bleeding.   Images permanently stored and available for review in the ultrasound unit.  Impression: Technically successful ultrasound guided injection.      Lab and Radiology Results MRI report from lumbar MRI from 2015 reviewed   Assessment and Plan: 57 y.o. female with  Left shoulder pain: Rotator cuff tendinitis versus subacromial impingement or bursitis.  Plan for physical therapy home exercise program oral NSAIDs and injection as above.  Recheck in 6 weeks or sooner if  needed.  Low back pain: Lumbosacral strain.  Plan for physical therapy relative rest heating pad TENS unit NSAIDs and cyclobenzaprine.  Recheck in 6 weeks.  Return sooner if needed.      Orders Placed This Encounter  Procedures  . Ambulatory referral to Physical Therapy    Referral Priority:   Routine    Referral Type:   Physical Medicine    Referral Reason:   Specialty Services Required    Requested Specialty:   Physical Therapy   Meds ordered this encounter  Medications  . cyclobenzaprine (FLEXERIL) 10 MG tablet    Sig: Take 0.5-1 tablets (5-10 mg total) by mouth 3 (three) times daily as needed for muscle spasms.    Dispense:  30 tablet    Refill:  0    Historical information moved to improve visibility of documentation.  Past Medical History:  Diagnosis Date  . Endometriosis   . Pneumonia   . WPW syndrome    Past Surgical History:  Procedure Laterality Date  . FOOT SURGERY  06/2010  . uterine tac  2001  . VAGINAL HYSTERECTOMY  2003   Social History   Tobacco Use  . Smoking status: Never Smoker  . Smokeless tobacco: Never Used  Substance Use Topics  . Alcohol use: Yes    Comment: rarely   family history includes Alzheimer's disease in her father; Hypertension in her father and mother; Stroke in her mother.  Medications: Current Outpatient Medications  Medication Sig Dispense Refill  . divalproex (DEPAKOTE ER) 500 MG 24 hr tablet Take 500 mg by mouth daily. Take 1 tablets by mouth.    . fexofenadine (ALLEGRA) 180 MG tablet Take 180 mg by mouth daily.    Marland Kitchen ibuprofen (ADVIL,MOTRIN) 800 MG tablet Take by mouth.    . cyclobenzaprine (FLEXERIL) 10 MG tablet Take 0.5-1 tablets (5-10 mg total) by mouth 3 (three) times daily as needed for muscle spasms. 30 tablet 0   No current facility-administered medications for this visit.    Allergies  Allergen Reactions  . Sumatriptan     REACTION: Passed out  . Clarithromycin Rash    REACTION: Rash      Discussed  warning signs or symptoms. Please see discharge instructions. Patient expresses understanding.

## 2017-10-12 NOTE — Patient Instructions (Signed)
Thank you for coming in today. Use the flexeril mostly at bedtime as needed at night.  Continue ibuprofen as needed of pain.  Attend PT Recheck with me in 6 weeks or sooner if needed.    Lumbosacral Strain Lumbosacral strain is an injury that causes pain in the lower back (lumbosacral spine). This injury usually occurs from overstretching the muscles or ligaments along your spine. A strain can affect one or more muscles or cord-like tissues that connect bones to other bones (ligaments). What are the causes? This condition may be caused by:  A hard, direct hit (blow) to the back.  Excessive stretching of the lower back muscles. This may result from: ? A fall. ? Lifting something heavy. ? Repetitive movements such as bending or crouching.  What increases the risk? The following factors may increase your risk of getting this condition:  Participating in sports or activities that involve: ? A sudden twist of the back. ? Pushing or pulling motions.  Being overweight or obese.  Having poor strength and flexibility, especially tight hamstrings or weak muscles in the back or abdomen.  Having too much of a curve in the lower back.  Having a pelvis that is tilted forward.  What are the signs or symptoms? The main symptom of this condition is pain in the lower back, at the site of the strain. Pain may extend (radiate) down one or both legs. How is this diagnosed? This condition is diagnosed based on:  Your symptoms.  Your medical history.  A physical exam. ? Your health care provider may push on certain areas of your back to determine the source of your pain. ? You may be asked to bend forward, backward, and side to side to assess the severity of your pain and your range of motion.  Imaging tests, such as: ? X-rays. ? MRI.  How is this treated? Treatment for this condition may include:  Putting heat and cold on the affected area.  Medicines to help relieve pain and relax  your muscles (muscle relaxants).  NSAIDs to help reduce swelling and discomfort.  When your symptoms improve, it is important to gradually return to your normal routine as soon as possible to reduce pain, avoid stiffness, and avoid loss of muscle strength. Generally, symptoms should improve within 6 weeks of treatment. However, recovery time varies. Follow these instructions at home: Managing pain, stiffness, and swelling   If directed, put ice on the injured area during the first 24 hours after your strain. ? Put ice in a plastic bag. ? Place a towel between your skin and the bag. ? Leave the ice on for 20 minutes, 2-3 times a day.  If directed, put heat on the affected area as often as told by your health care provider. Use the heat source that your health care provider recommends, such as a moist heat pack or a heating pad. ? Place a towel between your skin and the heat source. ? Leave the heat on for 20-30 minutes. ? Remove the heat if your skin turns bright red. This is especially important if you are unable to feel pain, heat, or cold. You may have a greater risk of getting burned. Activity  Rest and return to your normal activities as told by your health care provider. Ask your health care provider what activities are safe for you.  Avoid activities that take a lot of energy for as long as told by your health care provider. General instructions  Take over-the-counter  and prescription medicines only as told by your health care provider.  Donot drive or use heavy machinery while taking prescription pain medicine.  Do not use any products that contain nicotine or tobacco, such as cigarettes and e-cigarettes. If you need help quitting, ask your health care provider.  Keep all follow-up visits as told by your health care provider. This is important. How is this prevented?  Use correct form when playing sports and lifting heavy objects.  Use good posture when sitting and  standing.  Maintain a healthy weight.  Sleep on a mattress with medium firmness to support your back.  Be safe and responsible while being active to avoid falls.  Do at least 150 minutes of moderate-intensity exercise each week, such as brisk walking or water aerobics. Try a form of exercise that takes stress off your back, such as swimming or stationary cycling.  Maintain physical fitness, including: ? Strength. ? Flexibility. ? Cardiovascular fitness. ? Endurance. Contact a health care provider if:  Your back pain does not improve after 6 weeks of treatment.  Your symptoms get worse. Get help right away if:  Your back pain is severe.  You cannot stand or walk.  You have difficulty controlling when you urinate or when you have a bowel movement.  You feel nauseous or you vomit.  Your feet get very cold.  You have numbness, tingling, weakness, or problems using your arms or legs.  You develop any of the following: ? Shortness of breath. ? Dizziness. ? Pain in your legs. ? Weakness in your buttocks or legs. ? Discoloration of the skin on your toes or legs. This information is not intended to replace advice given to you by your health care provider. Make sure you discuss any questions you have with your health care provider. Document Released: 01/27/2005 Document Revised: 11/07/2015 Document Reviewed: 09/21/2015 Elsevier Interactive Patient Education  2018 [employer]    Rotator Cuff Tendinitis Rotator cuff tendinitis is inflammation of the tough, cord-like bands that connect muscle to bone (tendons) in the rotator cuff. The rotator cuff includes all of the muscles and tendons that connect the arm to the shoulder. The rotator cuff holds the head of the upper arm bone (humerus) in the cup (fossa) of the shoulder blade (scapula). This condition can lead to a long-lasting (chronic) tear. The tear may be partial or complete. What are the causes? This condition is usually  caused by overusing the rotator cuff. What increases the risk? This condition is more likely to develop in athletes and workers who frequently use their shoulder or reach over their heads. This can include activities such as:  Tennis.  Baseball or softball.  Swimming.  Construction work.  Painting.  What are the signs or symptoms? Symptoms of this condition include:  Pain spreading (radiating) from the shoulder to the upper arm.  Swelling and tenderness in front of the shoulder.  Pain when reaching, pulling, or lifting the arm above the head.  Pain when lowering the arm from above the head.  Minor pain in the shoulder when resting.  Increased pain in the shoulder at night.  Difficulty placing the arm behind the back.  How is this diagnosed? This condition is diagnosed with a medical history and physical exam. Tests may also be done, including:  X-rays.  MRI.  Ultrasounds.  CT or MR arthrogram. During this test, a contrast material is injected and then images are taken.  How is this treated? Treatment for this condition depends  on the severity of the condition. In less severe cases, treatment may include:  Rest. This may be done with a sling that holds the shoulder still (immobilization). Your health care provider may also recommend avoiding activities that involve lifting your arm over your head.  Icing the shoulder.  Anti-inflammatory medicines, such as aspirin or ibuprofen.  In more severe cases, treatment may include:  Physical therapy.  Steroid injections.  Surgery.  Follow these instructions at home: If you have a sling:  Wear the sling as told by your health care provider. Remove it only as told by your health care provider.  Loosen the sling if your fingers tingle, become numb, or turn cold and blue.  Keep the sling clean.  If the sling is not waterproof, do not let it get wet. Remove it, if allowed, or cover it with a watertight covering  when you take a bath or shower. Managing pain, stiffness, and swelling  If directed, put ice on the injured area. ? If you have a removable sling, remove it as told by your health care provider. ? Put ice in a plastic bag. ? Place a towel between your skin and the bag. ? Leave the ice on for 20 minutes, 2-3 times a day.  Move your fingers often to avoid stiffness and to lessen swelling.  Raise (elevate) the injured area above the level of your heart while you are lying down.  Find a comfortable sleeping position or sleep on a recliner, if available. Driving  Do not drive or use heavy machinery while taking prescription pain medicine.  Ask your health care provider when it is safe to drive if you have a sling on your arm. Activity  Rest your shoulder as told by your health care provider.  Return to your normal activities as told by your health care provider. Ask your health care provider what activities are safe for you.  Do any exercises or stretches as told by your health care provider.  If you do repetitive overhead tasks, take small breaks in between and include stretching exercises as told by your health care provider. General instructions  Do not use any products that contain nicotine or tobacco, such as cigarettes and e-cigarettes. These can delay healing. If you need help quitting, ask your health care provider.  Take over-the-counter and prescription medicines only as told by your health care provider.  Keep all follow-up visits as told by your health care provider. This is important. Contact a health care provider if:  Your pain gets worse.  You have new pain in your arm, hands, or fingers.  Your pain is not relieved with medicine or does not get better after 6 weeks of treatment.  You have cracking sensations when moving your shoulder in certain directions.  You hear a snapping sound after using your shoulder, followed by severe pain and weakness. Get help  right away if:  Your arm, hand, or fingers are numb or tingling.  Your arm, hand, or fingers are swollen or painful or they turn white or blue. Summary  Rotator cuff tendinitis is inflammation of the tough, cord-like bands that connect muscle to bone (tendons) in the rotator cuff.  This condition is usually caused by overusing the rotator cuff, which includes all of the muscles and tendons that connect the arm to the shoulder.  This condition is more likely to develop in athletes and workers who frequently use their shoulder or reach over their heads.  Treatment generally includes rest,  anti-inflammatory medicines, and icing. In some cases, physical therapy and steroid injections may be needed. In severe cases, surgery may be needed. This information is not intended to replace advice given to you by your health care provider. Make sure you discuss any questions you have with your health care provider. Document Released: 07/10/2003 Document Revised: 04/05/2016 Document Reviewed: 04/05/2016 Elsevier Interactive Patient Education  2017 [employer].

## 2017-12-07 DIAGNOSIS — G43711 Chronic migraine without aura, intractable, with status migrainosus: Secondary | ICD-10-CM | POA: Diagnosis not present

## 2018-01-10 ENCOUNTER — Telehealth: Payer: Self-pay

## 2018-01-10 DIAGNOSIS — R7989 Other specified abnormal findings of blood chemistry: Secondary | ICD-10-CM

## 2018-01-10 DIAGNOSIS — Z5181 Encounter for therapeutic drug level monitoring: Secondary | ICD-10-CM

## 2018-01-10 DIAGNOSIS — Z0000 Encounter for general adult medical examination without abnormal findings: Secondary | ICD-10-CM

## 2018-01-10 DIAGNOSIS — Z13220 Encounter for screening for lipoid disorders: Secondary | ICD-10-CM

## 2018-01-10 NOTE — Telephone Encounter (Signed)
Jessica Alexander would like fasting labs before the physical.

## 2018-01-11 NOTE — Telephone Encounter (Signed)
Labs ordered and printe.

## 2018-01-11 NOTE — Telephone Encounter (Signed)
Left message advising of recommendations.  

## 2018-01-12 DIAGNOSIS — Z0000 Encounter for general adult medical examination without abnormal findings: Secondary | ICD-10-CM | POA: Diagnosis not present

## 2018-01-12 DIAGNOSIS — R7989 Other specified abnormal findings of blood chemistry: Secondary | ICD-10-CM | POA: Diagnosis not present

## 2018-01-12 DIAGNOSIS — Z13220 Encounter for screening for lipoid disorders: Secondary | ICD-10-CM | POA: Diagnosis not present

## 2018-01-12 DIAGNOSIS — Z5181 Encounter for therapeutic drug level monitoring: Secondary | ICD-10-CM | POA: Diagnosis not present

## 2018-01-13 ENCOUNTER — Ambulatory Visit: Payer: BLUE CROSS/BLUE SHIELD | Admitting: Family Medicine

## 2018-01-13 ENCOUNTER — Encounter: Payer: Self-pay | Admitting: Family Medicine

## 2018-01-13 ENCOUNTER — Ambulatory Visit (INDEPENDENT_AMBULATORY_CARE_PROVIDER_SITE_OTHER): Payer: BLUE CROSS/BLUE SHIELD

## 2018-01-13 VITALS — BP 128/89 | HR 88 | Ht 69.0 in | Wt 206.0 lb

## 2018-01-13 DIAGNOSIS — G8929 Other chronic pain: Secondary | ICD-10-CM

## 2018-01-13 DIAGNOSIS — Z23 Encounter for immunization: Secondary | ICD-10-CM | POA: Diagnosis not present

## 2018-01-13 DIAGNOSIS — M546 Pain in thoracic spine: Secondary | ICD-10-CM | POA: Diagnosis not present

## 2018-01-13 DIAGNOSIS — Z1231 Encounter for screening mammogram for malignant neoplasm of breast: Secondary | ICD-10-CM

## 2018-01-13 DIAGNOSIS — Z5181 Encounter for therapeutic drug level monitoring: Secondary | ICD-10-CM | POA: Diagnosis not present

## 2018-01-13 DIAGNOSIS — Z0000 Encounter for general adult medical examination without abnormal findings: Secondary | ICD-10-CM

## 2018-01-13 DIAGNOSIS — R7989 Other specified abnormal findings of blood chemistry: Secondary | ICD-10-CM | POA: Diagnosis not present

## 2018-01-13 DIAGNOSIS — Z13220 Encounter for screening for lipoid disorders: Secondary | ICD-10-CM | POA: Diagnosis not present

## 2018-01-13 DIAGNOSIS — M47814 Spondylosis without myelopathy or radiculopathy, thoracic region: Secondary | ICD-10-CM | POA: Diagnosis not present

## 2018-01-13 LAB — CBC
HCT: 35 % (ref 35.0–45.0)
Hemoglobin: 11.9 g/dL (ref 11.7–15.5)
MCH: 31.9 pg (ref 27.0–33.0)
MCHC: 34 g/dL (ref 32.0–36.0)
MCV: 93.8 fL (ref 80.0–100.0)
MPV: 11.7 fL (ref 7.5–12.5)
Platelets: 229 10*3/uL (ref 140–400)
RBC: 3.73 10*6/uL — ABNORMAL LOW (ref 3.80–5.10)
RDW: 13.2 % (ref 11.0–15.0)
WBC: 5.2 10*3/uL (ref 3.8–10.8)

## 2018-01-13 LAB — COMPLETE METABOLIC PANEL WITH GFR
AG RATIO: 1.6 (calc) (ref 1.0–2.5)
ALT: 23 U/L (ref 6–29)
AST: 31 U/L (ref 10–35)
Albumin: 4 g/dL (ref 3.6–5.1)
Alkaline phosphatase (APISO): 57 U/L (ref 33–130)
BUN: 24 mg/dL (ref 7–25)
CALCIUM: 9.2 mg/dL (ref 8.6–10.4)
CO2: 28 mmol/L (ref 20–32)
Chloride: 106 mmol/L (ref 98–110)
Creat: 1 mg/dL (ref 0.50–1.05)
GFR, EST NON AFRICAN AMERICAN: 63 mL/min/{1.73_m2} (ref >=60)
GFR, Est African American: 73 mL/min/{1.73_m2} (ref >=60)
GLOBULIN: 2.5 g/dL (ref 1.9–3.7)
Glucose, Bld: 92 mg/dL (ref 65–99)
POTASSIUM: 4.5 mmol/L (ref 3.5–5.3)
Sodium: 140 mmol/L (ref 135–146)
Total Bilirubin: 0.3 mg/dL (ref 0.2–1.2)
Total Protein: 6.5 g/dL (ref 6.1–8.1)

## 2018-01-13 LAB — LIPID PANEL
CHOLESTEROL: 186 mg/dL (ref <200)
Cholesterol: 191 mg/dL (ref <200)
HDL: 68 mg/dL (ref >50)
HDL: 61 mg/dL (ref >50)
LDL Cholesterol (Calc): 97 mg/dL (calc)
LDL Cholesterol (Calc): 101 mg/dL (calc) — ABNORMAL HIGH
NON-HDL CHOLESTEROL (CALC): 123 mg/dL (ref <130)
NON-HDL CHOLESTEROL (CALC): 125 mg/dL (ref <130)
Total CHOL/HDL Ratio: 2.8 (calc) (ref <5.0)
Total CHOL/HDL Ratio: 3 (calc) (ref <5.0)
Triglycerides: 156 mg/dL — ABNORMAL HIGH (ref <150)
Triglycerides: 142 mg/dL (ref <150)

## 2018-01-13 LAB — T4, FREE: Free T4: 1 ng/dL (ref 0.8–1.8)

## 2018-01-13 LAB — TSH: TSH: 6.3 m[IU]/L — AB

## 2018-01-13 NOTE — Progress Notes (Signed)
Subjective:     Jessica Alexander is a 57 y.o. female and is here for a comprehensive physical exam. The patient reports problems - midback pain.  She has had some mid back pain  Social History   Socioeconomic History  . Marital status: Married    Spouse name: Not on file  . Number of children: Not on file  . Years of education: Not on file  . Highest education level: Not on file  Occupational History  . Occupation: retired    Associate Professor: [employer]  Social Needs  . Financial resource strain: Not on file  . Food insecurity:    Worry: Not on file    Inability: Not on file  . Transportation needs:    Medical: Not on file    Non-medical: Not on file  Tobacco Use  . Smoking status: Never Smoker  . Smokeless tobacco: Never Used  Substance and Sexual Activity  . Alcohol use: Yes    Comment: rarely  . Drug use: No  . Sexual activity: Yes    Partners: Male  Lifestyle  . Physical activity:    Days per week: Not on file    Minutes per session: Not on file  . Stress: Not on file  Relationships  . Social connections:    Talks on phone: Not on file    Gets together: Not on file    Attends religious service: Not on file    Active member of club or organization: Not on file    Attends meetings of clubs or organizations: Not on file    Relationship status: Not on file  . Intimate partner violence:    Fear of current or ex partner: Not on file    Emotionally abused: Not on file    Physically abused: Not on file    Forced sexual activity: Not on file  Other Topics Concern  . Not on file  Social History Narrative  . Not on file   Health Maintenance  Topic Date Due  . Hepatitis C Screening  1961/03/06  . HIV Screening  11/10/1977  . COLONOSCOPY  01/14/2019 (Originally 11/10/2012)  . MAMMOGRAM  06/30/2018  . TETANUS/TDAP  11/23/2021  . INFLUENZA VACCINE  Completed    The following portions of the patient's history were reviewed and updated as appropriate: allergies, current  medications, past family history, past medical history, past social history, past surgical history and problem list.  Review of Systems A comprehensive review of systems was negative.   Objective:    BP 128/89   Pulse 88   Ht 5\' 9"  (1.753 m)   Wt 206 lb (93.4 kg)   SpO2 97%   BMI 30.42 kg/m  General appearance: alert, cooperative and appears stated age Head: Normocephalic, without obvious abnormality, atraumatic Eyes: conj clear, EOMI, PEERLA Ears: normal TM's and external ear canals both ears Nose: Nares normal. Septum midline. Mucosa normal. No drainage or sinus tenderness. Throat: lips, mucosa, and tongue normal; teeth and gums normal Neck: no adenopathy, no carotid bruit, no JVD, supple, symmetrical, trachea midline and thyroid not enlarged, symmetric, no tenderness/mass/nodules Back: symmetric, no curvature. ROM normal. No CVA tenderness. Lungs: clear to auscultation bilaterally Breasts: normal appearance, no masses or tenderness Heart: regular rate and rhythm, S1, S2 normal, no murmur, click, rub or gallop Abdomen: soft, non-tender; bowel sounds normal; no masses,  no organomegaly Extremities: extremities normal, atraumatic, no cyanosis or edema Pulses: 2+ and symmetric Skin: Skin color, texture, turgor normal. No rashes or  lesions Lymph nodes: Cervical, supraclavicular, and axillary nodes normal. Neurologic: Alert and oriented X 3, normal strength and tone. Normal symmetric reflexes. Normal coordination and gait    Assessment:    Healthy female exam.      Plan:     See After Visit Summary for Counseling Recommendations   Keep up a regular exercise program and make sure you are eating a healthy diet Try to eat 4 servings of dairy a day, or if you are lactose intolerant take a calcium with vitamin D daily.  Your vaccines are up to date.   Mid back pain, chronic -get x-rays for further work-up.  Her pain is usually more lateral to the spine.  X-ray shows that she does  have some S-shaped scoliosis of the thoracolumbar spine.  But no significant degenerative  Disc changes.  She does have some mild osteophytic changes though think she would be a good candidate for physical therapy.  Abnormal TSH - plan to recheck.    Screen mammogram.

## 2018-01-13 NOTE — Patient Instructions (Signed)

## 2018-02-23 ENCOUNTER — Ambulatory Visit: Payer: BLUE CROSS/BLUE SHIELD

## 2018-03-02 ENCOUNTER — Ambulatory Visit: Payer: BLUE CROSS/BLUE SHIELD

## 2018-04-05 ENCOUNTER — Ambulatory Visit (INDEPENDENT_AMBULATORY_CARE_PROVIDER_SITE_OTHER): Payer: BLUE CROSS/BLUE SHIELD

## 2018-04-05 DIAGNOSIS — Z1231 Encounter for screening mammogram for malignant neoplasm of breast: Secondary | ICD-10-CM | POA: Diagnosis not present

## 2018-10-09 ENCOUNTER — Ambulatory Visit: Payer: BC Managed Care – PPO | Admitting: Sports Medicine

## 2018-11-17 ENCOUNTER — Encounter: Payer: Self-pay | Admitting: Sports Medicine

## 2018-11-17 ENCOUNTER — Other Ambulatory Visit: Payer: Self-pay | Admitting: Sports Medicine

## 2018-11-17 ENCOUNTER — Ambulatory Visit (HOSPITAL_BASED_OUTPATIENT_CLINIC_OR_DEPARTMENT_OTHER): Payer: BC Managed Care – PPO

## 2018-11-17 ENCOUNTER — Ambulatory Visit: Payer: BC Managed Care – PPO | Admitting: Sports Medicine

## 2018-11-17 ENCOUNTER — Other Ambulatory Visit: Payer: Self-pay

## 2018-11-17 ENCOUNTER — Ambulatory Visit (INDEPENDENT_AMBULATORY_CARE_PROVIDER_SITE_OTHER): Payer: BC Managed Care – PPO

## 2018-11-17 DIAGNOSIS — M76821 Posterior tibial tendinitis, right leg: Secondary | ICD-10-CM

## 2018-11-17 DIAGNOSIS — M7731 Calcaneal spur, right foot: Secondary | ICD-10-CM | POA: Diagnosis not present

## 2018-11-17 NOTE — Progress Notes (Signed)
Subjective:    CC: Foot pain  HPI: This is a pleasant 58 year old female, for the past 4 to 6 months she has had severe pain that she localizes behind her medial malleolus on the right.  Worse with prolonged weightbearing, she has tried some oral analgesics without much improvement.  I reviewed the past medical history, family history, social history, surgical history, and allergies today and no changes were needed.  Please see the problem list section below in epic for further details.  Past Medical History: Past Medical History:  Diagnosis Date  . Endometriosis   . Pneumonia   . WPW syndrome    Past Surgical History: Past Surgical History:  Procedure Laterality Date  . FOOT SURGERY  06/2010  . uterine tac  2001  . VAGINAL HYSTERECTOMY  2003   Social History: Social History   Socioeconomic History  . Marital status: Married    Spouse name: Not on file  . Number of children: Not on file  . Years of education: Not on file  . Highest education level: Not on file  Occupational History  . Occupation: retired    Associate Professormployer: [employer]  Social Needs  . Financial resource strain: Not on file  . Food insecurity    Worry: Not on file    Inability: Not on file  . Transportation needs    Medical: Not on file    Non-medical: Not on file  Tobacco Use  . Smoking status: Never Smoker  . Smokeless tobacco: Never Used  Substance and Sexual Activity  . Alcohol use: Yes    Comment: rarely  . Drug use: No  . Sexual activity: Yes    Partners: Male  Lifestyle  . Physical activity    Days per week: Not on file    Minutes per session: Not on file  . Stress: Not on file  Relationships  . Social Musicianconnections    Talks on phone: Not on file    Gets together: Not on file    Attends religious service: Not on file    Active member of club or organization: Not on file    Attends meetings of clubs or organizations: Not on file    Relationship status: Not on file  Other Topics Concern   . Not on file  Social History Narrative  . Not on file   Family History: Family History  Problem Relation Age of Onset  . Hypertension Mother   . Stroke Mother        x 2  . Hypertension Father   . Alzheimer's disease Father    Allergies: Allergies  Allergen Reactions  . Sumatriptan     REACTION: Passed out  . Clarithromycin Rash    REACTION: Rash   Medications: See med rec.  Review of Systems: No fevers, chills, night sweats, weight loss, chest pain, or shortness of breath.   Objective:    General: Well Developed, well nourished, and in no acute distress.  Neuro: Alert and oriented x3, extra-ocular muscles intact, sensation grossly intact.  HEENT: Normocephalic, atraumatic, pupils equal round reactive to light, neck supple, no masses, no lymphadenopathy, thyroid nonpalpable.  Skin: Warm and dry, no rashes. Cardiac: Regular rate and rhythm, no murmurs rubs or gallops, no lower extremity edema.  Respiratory: Clear to auscultation bilaterally. Not using accessory muscles, speaking in full sentences. Right ankle: No visible erythema or swelling. Range of motion is full in all directions. Strength is 5/5 in all directions. Stable lateral and medial ligaments; squeeze  test and kleiger test unremarkable; Talar dome nontender; No pain at base of 5th MT; No tenderness over cuboid; No tenderness over N spot or navicular prominence Tender to palpation behind the medial malleolus with reproduction of pain with resisted inversion of the ankle consistent with a tibialis posterior pain generator. No sign of peroneal tendon subluxations; Negative tarsal tunnel tinel's Able to walk 4 steps.  Impression and Recommendations:    Tibial tendinitis, posterior, right Present for 4 to 6 months now, adding a cam boot with scaphoid pads. Referral to Dr. Raeford Razor for custom orthotics. Continue ibuprofen 800 mg 3 times daily. Adding rehabilitation exercises. Wear the boot for 3 to 4 weeks,  and return to see me afterwards. If persistent pain I will inject medicine with ultrasound guidance around the tibialis posterior tendon and then boot her for another week afterwards to protect the tendon post injection.   ___________________________________________ Gwen Her. Dianah Field, M.D., ABFM., CAQSM. Primary Care and Sports Medicine [facility]  Adjunct Professor of [employer] [employer] [facility] [employer]

## 2018-11-17 NOTE — Assessment & Plan Note (Signed)
Present for 4 to 6 months now, adding a cam boot with scaphoid pads. Referral to Dr. Raeford Razor for custom orthotics. Continue ibuprofen 800 mg 3 times daily. Adding rehabilitation exercises. Wear the boot for 3 to 4 weeks, and return to see me afterwards. If persistent pain I will inject medicine with ultrasound guidance around the tibialis posterior tendon and then boot her for another week afterwards to protect the tendon post injection.

## 2018-11-22 ENCOUNTER — Encounter: Payer: Self-pay | Admitting: Family Medicine

## 2018-11-22 ENCOUNTER — Other Ambulatory Visit: Payer: Self-pay

## 2018-11-22 ENCOUNTER — Ambulatory Visit: Payer: BC Managed Care – PPO | Admitting: Family Medicine

## 2018-11-22 VITALS — BP 110/78 | Ht 69.0 in | Wt 210.0 lb

## 2018-11-22 DIAGNOSIS — M76821 Posterior tibial tendinitis, right leg: Secondary | ICD-10-CM | POA: Diagnosis not present

## 2018-11-22 DIAGNOSIS — R269 Unspecified abnormalities of gait and mobility: Secondary | ICD-10-CM | POA: Diagnosis not present

## 2018-11-22 NOTE — Progress Notes (Signed)
PCP: Agapito GamesMetheney, Catherine D, MD  Subjective:   HPI: Patient is a 58 y.o. female here for custom orthotics.  She was referred by Dr. Benjamin Stainhekkekandam.  Patient has been having pain of her right posterior tibial tendon for the last 4 to 6 months.  She was recently put in a cam boot this last week for relative rest of her tendon.  She notes she has constant pain with walking or standing, sharp.  She denies any injury or trauma to her ankle.  She has no numbness or tingling in her foot and she denies any swelling or skin changes.  The pain has spread throughout the calcaneus over the last several months but does not radiate up her leg or down her foot.  Review of Systems: See HPI above.  Past Medical History:  Diagnosis Date  . Endometriosis   . Pneumonia   . WPW syndrome     Current Outpatient Medications on File Prior to Visit  Medication Sig Dispense Refill  . divalproex (DEPAKOTE ER) 500 MG 24 hr tablet Take 500 mg by mouth daily. Take 1 tablets by mouth.    . fexofenadine (ALLEGRA) 180 MG tablet Take 180 mg by mouth daily.    . naratriptan (AMERGE) 2.5 MG tablet Take 2.5 mg by mouth.  11   No current facility-administered medications on file prior to visit.     Past Surgical History:  Procedure Laterality Date  . FOOT SURGERY  06/2010  . uterine tac  2001  . VAGINAL HYSTERECTOMY  2003    Allergies  Allergen Reactions  . Sumatriptan     REACTION: Passed out  . Clarithromycin Rash    REACTION: Rash    Social History   Socioeconomic History  . Marital status: Married    Spouse name: Not on file  . Number of children: Not on file  . Years of education: Not on file  . Highest education level: Not on file  Occupational History  . Occupation: retired    Associate Professormployer: [employer]  Social Needs  . Financial resource strain: Not on file  . Food insecurity    Worry: Not on file    Inability: Not on file  . Transportation needs    Medical: Not on file    Non-medical: Not on  file  Tobacco Use  . Smoking status: Never Smoker  . Smokeless tobacco: Never Used  Substance and Sexual Activity  . Alcohol use: Yes    Comment: rarely  . Drug use: No  . Sexual activity: Yes    Partners: Male  Lifestyle  . Physical activity    Days per week: Not on file    Minutes per session: Not on file  . Stress: Not on file  Relationships  . Social Musicianconnections    Talks on phone: Not on file    Gets together: Not on file    Attends religious service: Not on file    Active member of club or organization: Not on file    Attends meetings of clubs or organizations: Not on file    Relationship status: Not on file  . Intimate partner violence    Fear of current or ex partner: Not on file    Emotionally abused: Not on file    Physically abused: Not on file    Forced sexual activity: Not on file  Other Topics Concern  . Not on file  Social History Narrative  . Not on file    Family History  Problem Relation Age of Onset  . Hypertension Mother   . Stroke Mother        x 2  . Hypertension Father   . Alzheimer's disease Father         Objective:  Physical Exam: BP 110/78   Ht 5\' 9"  (1.753 m)   Wt 210 lb (95.3 kg)   BMI 31.01 kg/m  Gen: NAD, comfortable in exam room Lungs: Breathing comfortably on room air Ankle/Foot Exam right -Inspection: No deformity, no discoloration. Normal longitudinal and transverse arch. No splaying of her toes. -Palpation: Tenderness over posterior tibial tendon. Tenderness spreads throughout medial and lateral aspects of her calcaneus -ROM: Dorsiflexion: 20 degrees; plantarflexion: 50 degrees; Inversion: 35 degrees; Eversion: 25 degrees -Strength: Dorsiflexion: 5/5; Plantarflexion: 4/5; Inversion: 4/5; Eversion: 4/5. Strength limited due to pain -Limb neurovascularly intact  Contralateral Ankle -Inspection: No deformity, no discoloration. Normal longitudinal and transverse arch -Palpation: Medial malleolus: non-tender; lateral  malleolus: non-tender; 5th metatarsal: non-tender; calcaneus: non-tender; plantar fascia insertion: non-tender -ROM: Dorsiflexion: 20 degrees; plantarflexion: 50 degrees; Inversion: 35 degrees; Eversion: 25 degrees -Strength: Dorsiflexion: 5/5; Plantarflexion: 5/5; Inversion: 5/5; Eversion: 5/5 -Limb neurovascularly intact  -Gait: Limping.  Did not bring shoes to assess with orthotics in place. Leg lengths equal  Assessment & Plan:  Patient is a 58 y.o. female here for custom orthotics  1. Posterior tibial tendinitis and associated gait abnormality Patient was fitted for a : standard, cushioned, semi-rigid orthotic. The orthotic was heated and afterward the patient stood on the orthotic blank positioned on the orthotic stand. The patient was positioned in subtalar neutral position and 10 degrees of ankle dorsiflexion in a weight bearing stance. After completion of molding, a stable base was applied to the orthotic blank. The blank was ground to a stable position for weight bearing. Size: 7 red cambray Base: Medium density EVA foam Posting: None Additional orthotic padding: None  Patient was without her normal shoes today. She is to try the orthotics at home. If she has discomfort while wearing them, she will return for modifications as needed.  F/u with Dr. Darene Lamer for her ankle pain otherwise.

## 2018-11-23 ENCOUNTER — Encounter: Payer: Self-pay | Admitting: Family Medicine

## 2018-11-24 IMAGING — DX DG THORACIC SPINE 3V
3 series · 3 of 3 positions shown · non-contrast
Comparison: 01/12/2010

CLINICAL DATA: Left-sided back, initial encounter

EXAM:
THORACIC SPINE - 3 VIEWS

[t-spine ap]
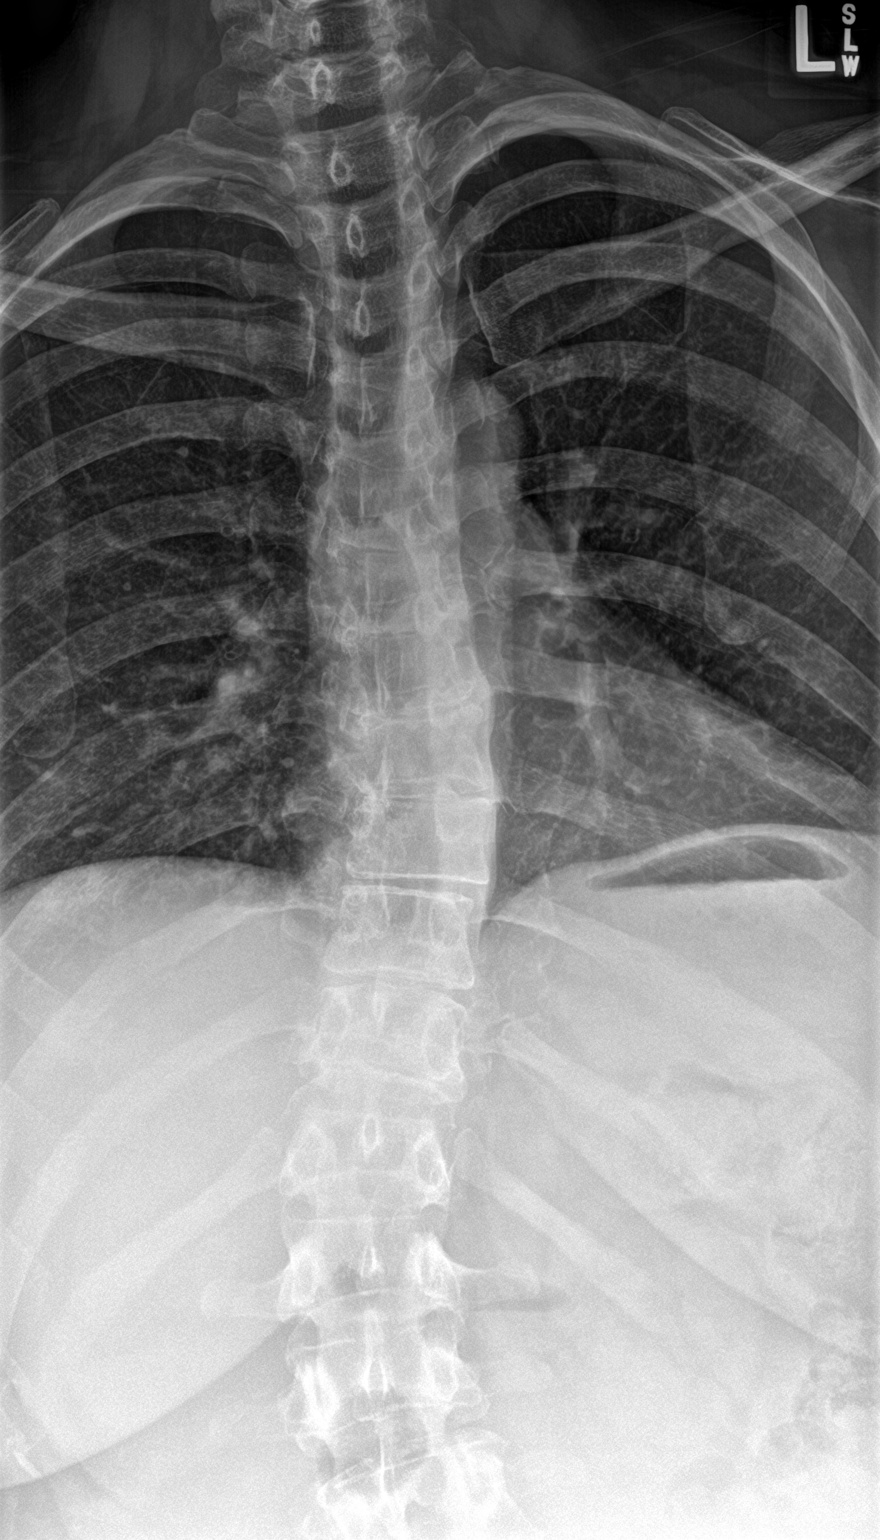

[t-spine lat]
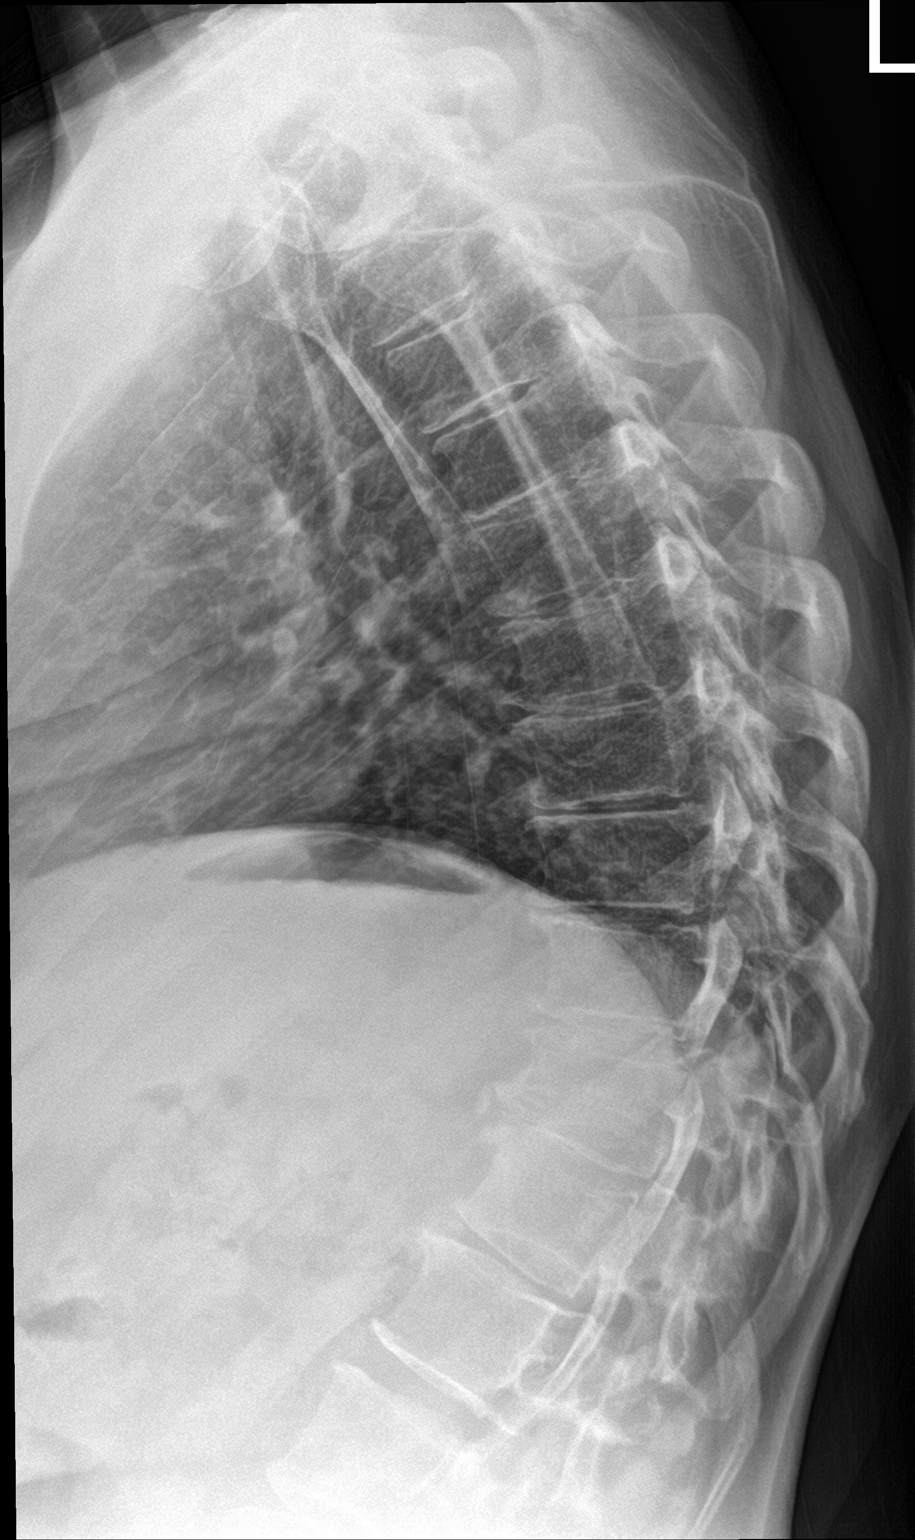

[t-spine swimmers]
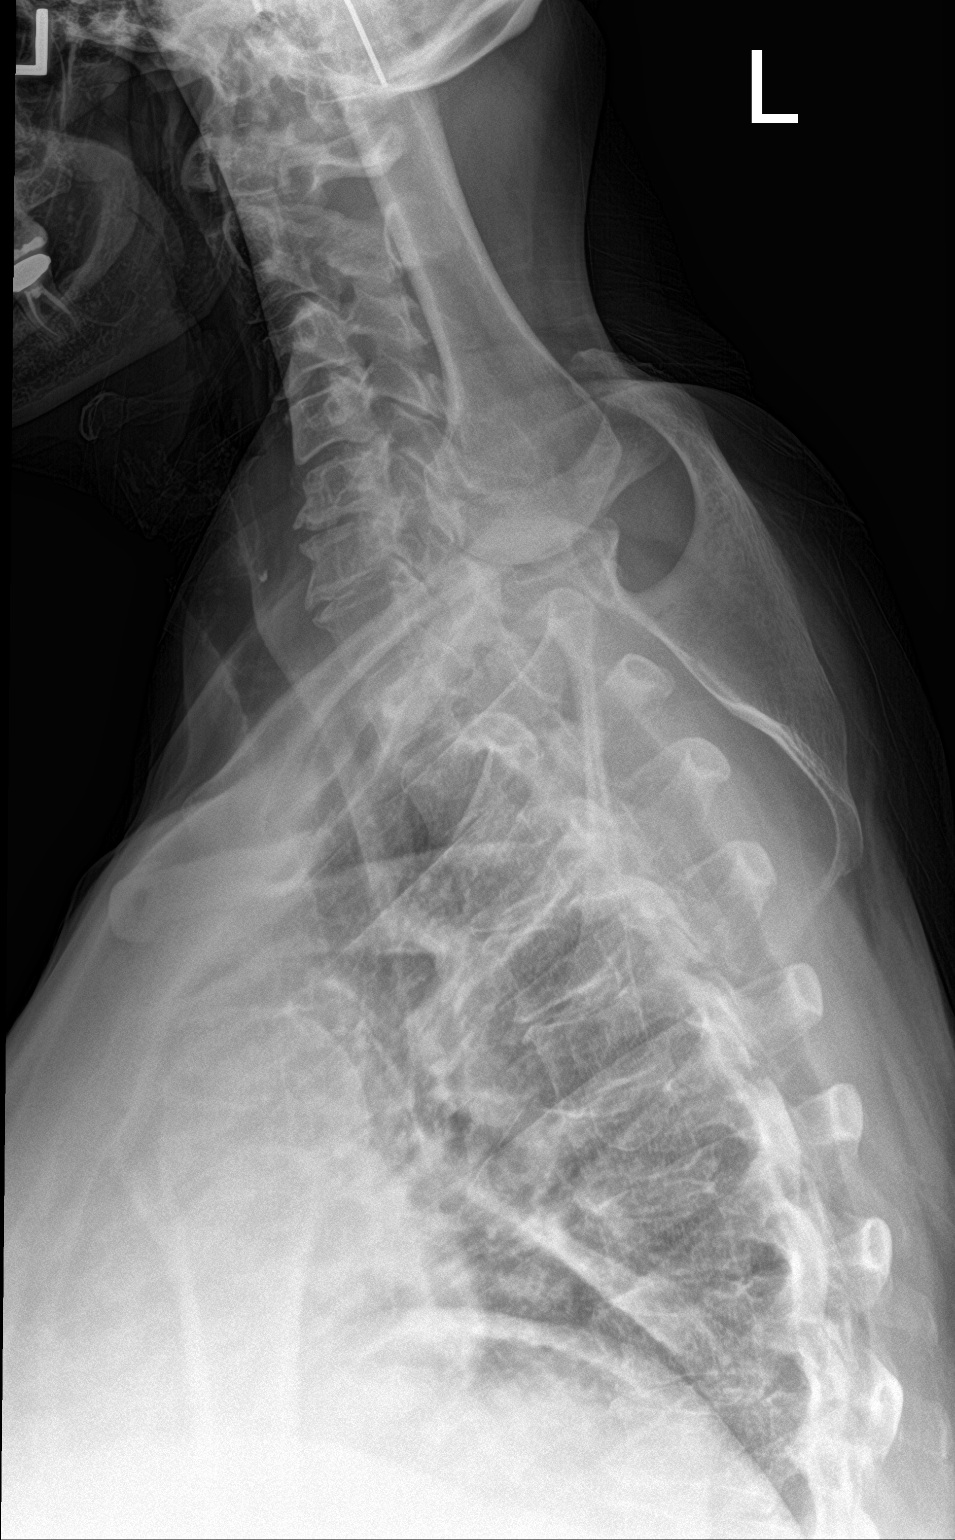

[3 of 3 positions shown; findings below may reference images not displayed]

FINDINGS: Mild S-shaped scoliosis of the thoracolumbar spine is identified and
relatively stable from the prior exam. Pedicles are within normal
limits. No paraspinal mass is seen. Mild osteophytic changes are
noted. No definitive compression deformity is noted.
IMPRESSION: Scoliosis and chronic degenerative changes slightly progressed from
the prior exam.

## 2018-12-25 ENCOUNTER — Ambulatory Visit: Payer: BC Managed Care – PPO | Admitting: Sports Medicine

## 2019-01-15 ENCOUNTER — Ambulatory Visit: Payer: BC Managed Care – PPO | Admitting: Sports Medicine

## 2019-02-01 ENCOUNTER — Ambulatory Visit: Payer: BC Managed Care – PPO | Admitting: Sports Medicine

## 2019-04-02 DIAGNOSIS — G43711 Chronic migraine without aura, intractable, with status migrainosus: Secondary | ICD-10-CM | POA: Diagnosis not present

## 2019-07-06 ENCOUNTER — Ambulatory Visit: Payer: BC Managed Care – PPO | Admitting: Family Medicine

## 2019-07-06 ENCOUNTER — Encounter: Payer: Self-pay | Admitting: Family Medicine

## 2019-07-06 ENCOUNTER — Other Ambulatory Visit: Payer: Self-pay

## 2019-07-06 VITALS — BP 128/80 | HR 79 | Temp 97.5°F | Ht 69.0 in | Wt 205.0 lb

## 2019-07-06 DIAGNOSIS — M5412 Radiculopathy, cervical region: Secondary | ICD-10-CM

## 2019-07-06 DIAGNOSIS — M5430 Sciatica, unspecified side: Secondary | ICD-10-CM | POA: Insufficient documentation

## 2019-07-06 DIAGNOSIS — M5432 Sciatica, left side: Secondary | ICD-10-CM | POA: Diagnosis not present

## 2019-07-06 DIAGNOSIS — G8929 Other chronic pain: Secondary | ICD-10-CM

## 2019-07-06 DIAGNOSIS — M546 Pain in thoracic spine: Secondary | ICD-10-CM | POA: Diagnosis not present

## 2019-07-06 MED ORDER — KETOROLAC TROMETHAMINE 60 MG/2ML IM SOLN
60.0000 mg | Freq: Once | INTRAMUSCULAR | Status: AC
  Administered 2019-07-06: 60 mg via INTRAMUSCULAR

## 2019-07-06 MED ORDER — METHYLPREDNISOLONE 4 MG PO TBPK: ORAL_TABLET | ORAL | 0 refills | Status: DC

## 2019-07-06 MED ORDER — TIZANIDINE HCL 4 MG PO TABS: 4.0000 mg | ORAL_TABLET | Freq: Four times a day (QID) | ORAL | 0 refills | Status: DC | PRN

## 2019-07-06 NOTE — Progress Notes (Signed)
Jessica Alexander - 59 y.o. female MRN [id number]  Date of birth: 08-05-1960  Subjective Chief Complaint  Patient presents with  . Back Pain    HPI Jessica Alexander is a 59 y.o. female with history of migraines here today with complaint of back pain.  She reports she was reaching down to pick up a cup and had pain down her neck and L shoulder.  After standing up she then started having pain down her L leg as well.  She denies weakness in her extremities.  She does have some mild tingling into her L arm.  Denies problems with bladder or bowel control.  She has tried ibuprofen without much relief.  She has been under more stress recently as well due to her mother being ill with her being the only one to help care for her.    ROS:  A comprehensive ROS was completed and negative except as noted per HPI  Allergies  Allergen Reactions  . Sumatriptan Other (See Comments)    REACTION: Passed out  . Clarithromycin Rash    REACTION: Rash    Past Medical History:  Diagnosis Date  . Endometriosis   . Pneumonia   . WPW syndrome     Past Surgical History:  Procedure Laterality Date  . FOOT SURGERY  06/2010  . uterine tac  2001  . VAGINAL HYSTERECTOMY  2003    Social History   Socioeconomic History  . Marital status: Married    Spouse name: Not on file  . Number of children: Not on file  . Years of education: Not on file  . Highest education level: Not on file  Occupational History  . Occupation: retired    Associate Professor: [employer]  Tobacco Use  . Smoking status: Never Smoker  . Smokeless tobacco: Never Used  Substance and Sexual Activity  . Alcohol use: Yes    Comment: rarely  . Drug use: No  . Sexual activity: Yes    Partners: Male  Other Topics Concern  . Not on file  Social History Narrative  . Not on file   Social Determinants of Health   Financial Resource Strain:   . Difficulty of Paying Living Expenses: Not on file  Food Insecurity:   . Worried About Patent examiner in the Last Year: Not on file  . Ran Out of Food in the Last Year: Not on file  Transportation Needs:   . Lack of Transportation (Medical): Not on file  . Lack of Transportation (Non-Medical): Not on file  Physical Activity:   . Days of Exercise per Week: Not on file  . Minutes of Exercise per Session: Not on file  Stress:   . Feeling of Stress : Not on file  Social Connections:   . Frequency of Communication with Friends and Family: Not on file  . Frequency of Social Gatherings with Friends and Family: Not on file  . Attends Religious Services: Not on file  . Active Member of Clubs or Organizations: Not on file  . Attends Banker Meetings: Not on file  . Marital Status: Not on file    Family History  Problem Relation Age of Onset  . Hypertension Mother   . Stroke Mother        x 2  . Hypertension Father   . Alzheimer's disease Father     Health Maintenance  Topic Date Due  . Hepatitis C Screening  1960/11/05  . HIV Screening  11/10/1977  .  COLONOSCOPY  11/10/2012  . INFLUENZA VACCINE  12/02/2018  . MAMMOGRAM  04/05/2020  . TETANUS/TDAP  11/23/2021     ----------------------------------------------------------------------------------------------------------------------------------------------------------------------------------------------------------------- Physical Exam BP (!) 141/79   Pulse 79   Temp (!) 97.5 F (36.4 C) (Oral)   Ht 5\' 9"  (1.753 m)   Wt 205 lb (93 kg)   BMI 30.27 kg/m   Physical Exam Constitutional:      Appearance: Normal appearance.  HENT:     Head: Normocephalic and atraumatic.  Cardiovascular:     Rate and Rhythm: Normal rate and regular rhythm.  Pulmonary:     Effort: Pulmonary effort is normal.     Breath sounds: Normal breath sounds.  Skin:    General: Skin is warm and dry.  Neurological:     General: No focal deficit present.     Mental Status: She is alert.     Comments: Antalgic gait.  Strength is 5/5  in Bilateral lower extremities, 5/5 in bilateral upper extremities.  She does have pain with ROM testing of the L shoulder.  She also has a +SLR with pain and +tripod sign.  Reflexe 2+ bilaterally.   Psychiatric:        Mood and Affect: Mood normal.        Behavior: Behavior normal.     ------------------------------------------------------------------------------------------------------------------------------------------------------------------------------------------------------------------- Assessment and Plan  Left cervical radiculopathy Exacerbation of her cervical radiculopathy today.  She does have some surrounding spasm as well  Toradol 60mg  given today.  Add on medrol dosepak and tizanidine.  Icing and gentle stretches advised.  Call if symptoms are not improving or having new or worsening symptoms.   Sciatica Similar treatment as cervical radiculopathy with toradol, medrol dosepak and tizanidine.  Given handout for home stretches as well.  Red flags reviewed.  If not improving with this we discussed referral to PT.    Meds ordered this encounter  Medications  . methylPREDNISolone (MEDROL DOSEPAK) 4 MG TBPK tablet    Sig: Taper as directed on packaging    Dispense:  21 tablet    Refill:  0  . tiZANidine (ZANAFLEX) 4 MG tablet    Sig: Take 1 tablet (4 mg total) by mouth every 6 (six) hours as needed for muscle spasms.    Dispense:  30 tablet    Refill:  0    No follow-ups on file.    This visit occurred during the SARS-CoV-2 public health emergency.  Safety protocols were in place, including screening questions prior to the visit, additional usage of staff PPE, and extensive cleaning of exam room while observing appropriate contact time as indicated for disinfecting solutions.

## 2019-07-06 NOTE — Assessment & Plan Note (Addendum)
Similar treatment as cervical radiculopathy with toradol, medrol dosepak and tizanidine.  Given handout for home stretches as well.  Red flags reviewed.  If not improving with this we discussed referral to PT.

## 2019-07-06 NOTE — Patient Instructions (Addendum)
Pinched Nerve A pinched nerve is an injury that occurs when too much pressure is placed on a nerve. This pressure can cause pain, burning, and muscle weakness in places that the nerve supplies feeling to, such as an arm, hand, or leg, or the back or neck. If a nerve is severely pinched or has been pinched for a long time, permanent nerve damage can occur. What are the causes? This condition may be caused by:  A nerve passing through a narrow area between bones or other body structures.  Arthritis that causes bones to press on a nerve.  Loss of blood supply to a nerve.  A nerve being stretched from an injury.  A sudden injury with swelling.  Long-term wear on the nerve.  Age-related changes in the spine. What are the signs or symptoms? The most common symptoms of a pinched nerve are feeling a tingling sensation and numbness. Other symptoms include:  Pain that spreads from one area of the body part to another.  A burning feeling.  Muscle weakness. How is this diagnosed? This condition may be diagnosed based on:  A physical exam. During the exam, your health care provider will: ? Check for numbness and muscle weakness. ? Move affected body parts to test for pain.  X-rays to check for bone damage.  A MRI or CT scan to check for conditions that may be causing nerve damage.  A muscle test (electromyogram, EMG) to evaluate how your muscles and nerves communicate. How is this treated?     A pinched nerve is usually treated first by:  Resting the affected body area.  Using devices to help you move without pain (supportive or protective devices), such as a splint, brace, or neck collar. Other treatments depend on your symptoms and the amount of nerve damage you have. Other treatments may include:  Medicines, such as: ? Injections of numbing medicine. ? NSAIDs. ? Pain medicines. ? Steroid medicines. These may be given as a pill or as an injection.  Physical therapy to  relieve pain, maintain movement, and improve muscle strength.  Surgery. This may be done if other treatments do not work. Follow these instructions at home:      Take over-the-counter and prescription medicines only as told by your health care provider.  Wear supportive or protective devices as told by your health care provider.  Do physical therapy exercises as directed.  Ask your health care provider what activities are safe for you.  Rest as needed.  If directed, put ice on the affected area: ? Put ice in a plastic bag. ? Place a towel between your skin and the bag. ? Leave the ice on for 20 minutes, 2-3 times a day.  If directed, apply heat to the affected area. Use the heat source that your health care provider recommends, such as a moist heat pack or a heating pad. ? Place a towel between your skin and the heat source. ? Leave the heat on for 20-30 minutes. ? Remove the heat if your skin turns bright red. This is especially important if you are unable to feel pain, heat, or cold. You may have a greater risk of getting burned.  Do not drive or use heavy machinery while taking prescription pain medicine.  Keep all follow-up visits as told by your health care provider. This is important. Contact a health care provider if:  Your condition does not improve with treatment.  Your pain, numbness, or weakness suddenly gets worse. Get help   right away if you:  Have loss of bladder control (urinary incontinence) or you cannot urinate.  Cannot control bowel movements (fecal incontinence).  Have new weakness in your arms or legs. Summary  A pinched nerve is an injury that occurs when too much pressure is placed on a nerve.  This pressure can cause pain, burning, and muscle weakness in places that the nerve supplies feeling to, such as an arm, hand, or leg, or the back or neck.  Take over-the-counter and prescription medicines only as told by your health care provider.  Ask  your health care provider what activities are safe for you while you are having symptoms. This information is not intended to replace advice given to you by your health care provider. Make sure you discuss any questions you have with your health care provider. Document Revised: 05/06/2017 Document Reviewed: 05/03/2017 Elsevier Patient Education  2020 [employer].

## 2019-07-06 NOTE — Assessment & Plan Note (Signed)
Exacerbation of her cervical radiculopathy today.  She does have some surrounding spasm as well  Toradol 60mg  given today.  Add on medrol dosepak and tizanidine.  Icing and gentle stretches advised.  Call if symptoms are not improving or having new or worsening symptoms.

## 2019-08-07 ENCOUNTER — Ambulatory Visit: Payer: BC Managed Care – PPO | Admitting: Family Medicine

## 2019-08-13 DIAGNOSIS — Z23 Encounter for immunization: Secondary | ICD-10-CM | POA: Diagnosis not present

## 2019-09-11 ENCOUNTER — Ambulatory Visit: Payer: BC Managed Care – PPO | Admitting: Family Medicine

## 2019-11-02 ENCOUNTER — Telehealth: Payer: Self-pay | Admitting: Family Medicine

## 2019-11-02 DIAGNOSIS — Z1211 Encounter for screening for malignant neoplasm of colon: Secondary | ICD-10-CM

## 2019-11-02 NOTE — Telephone Encounter (Signed)
Call pt: she is due for colon ca screening. See if she would like to do one form of screening.

## 2019-11-13 NOTE — Telephone Encounter (Signed)
Referral placed for digestive health

## 2019-11-29 ENCOUNTER — Ambulatory Visit: Payer: BC Managed Care – PPO | Admitting: Family Medicine

## 2019-11-29 ENCOUNTER — Encounter: Payer: Self-pay | Admitting: Family Medicine

## 2019-11-29 DIAGNOSIS — S29012A Strain of muscle and tendon of back wall of thorax, initial encounter: Secondary | ICD-10-CM | POA: Insufficient documentation

## 2019-11-29 DIAGNOSIS — M25511 Pain in right shoulder: Secondary | ICD-10-CM | POA: Diagnosis not present

## 2019-11-29 MED ORDER — CYCLOBENZAPRINE HCL 10 MG PO TABS: 5.0000 mg | ORAL_TABLET | Freq: Three times a day (TID) | ORAL | 0 refills | Status: DC | PRN
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
## 2019-12-07 ENCOUNTER — Other Ambulatory Visit: Payer: Self-pay | Admitting: Family Medicine

## 2019-12-07 ENCOUNTER — Ambulatory Visit: Payer: BC Managed Care – PPO | Admitting: Family Medicine

## 2019-12-07 ENCOUNTER — Ambulatory Visit (INDEPENDENT_AMBULATORY_CARE_PROVIDER_SITE_OTHER): Payer: BC Managed Care – PPO

## 2019-12-07 ENCOUNTER — Other Ambulatory Visit: Payer: Self-pay

## 2019-12-07 ENCOUNTER — Encounter: Payer: Self-pay | Admitting: Family Medicine

## 2019-12-07 VITALS — BP 128/88 | HR 86 | Ht 69.0 in | Wt 212.0 lb

## 2019-12-07 DIAGNOSIS — R42 Dizziness and giddiness: Secondary | ICD-10-CM

## 2019-12-07 DIAGNOSIS — J9811 Atelectasis: Secondary | ICD-10-CM | POA: Diagnosis not present

## 2019-12-07 DIAGNOSIS — Z1231 Encounter for screening mammogram for malignant neoplasm of breast: Secondary | ICD-10-CM

## 2019-12-07 DIAGNOSIS — R0602 Shortness of breath: Secondary | ICD-10-CM | POA: Diagnosis not present

## 2019-12-07 DIAGNOSIS — F439 Reaction to severe stress, unspecified: Secondary | ICD-10-CM

## 2019-12-07 DIAGNOSIS — R0789 Other chest pain: Secondary | ICD-10-CM | POA: Diagnosis not present

## 2019-12-07 NOTE — Progress Notes (Signed)
Established Patient Office Visit  Subjective:  Patient ID: Jessica Alexander, female    DOB: 11-22-1960  Age: 59 y.o. MRN: [id number]  CC:  Chief Complaint  Patient presents with  . Chest Pain  . Dizziness    HPI Jessica Alexander presents for chest pain on and off x 2 weeks. CP is usually in the center of the chest and feels hard to take a deep breath when it happens.  Occ feels dizzy with standing but not with just moving her hand.  Has been under a lot of stress helping to take care of her mother who has Alzheimer's..  Her cousin recently passed away as well..   She is also been struggling with her brother who lives in [place] who has not really been helping take care of their mother.  Though he has agreed to help pay for part of her care but she says she has to constantly get on him to actually pay his part.  She says that the chest pain will often come on underneath the left breast.  It can last for 20-30 minutes at a time usually actually occurs with rest such as when she is sitting putting her make-up on she says she almost never notices it with activity she says it does not radiate up into her neck but sometimes will actually feel dizzy with it or even get a mild headache with it she says it is different from her typical migraine headaches.  She has not had any cough or upper respiratory symptoms.  She denies any heartburn reflux.  She denies any nausea vomiting or diarrhea.  She denies constipation she has not been belching more than usual.  She has not noticed any racing heart symptoms as she does have a history of WPW.  Past Medical History:  Diagnosis Date  . Endometriosis   . Pneumonia   . WPW syndrome     Past Surgical History:  Procedure Laterality Date  . FOOT SURGERY  06/2010  . uterine tac  2001  . VAGINAL HYSTERECTOMY  2003    Family History  Problem Relation Age of Onset  . Hypertension Mother   . Stroke Mother        x 2  . Hypertension Father   . Alzheimer's  disease Father     Social History   Socioeconomic History  . Marital status: Married    Spouse name: Not on file  . Number of children: Not on file  . Years of education: Not on file  . Highest education level: Not on file  Occupational History  . Occupation: retired    Associate Professor: [employer]  Tobacco Use  . Smoking status: Never Smoker  . Smokeless tobacco: Never Used  Substance and Sexual Activity  . Alcohol use: Yes    Comment: rarely  . Drug use: No  . Sexual activity: Yes    Partners: Male  Other Topics Concern  . Not on file  Social History Narrative  . Not on file   Social Determinants of Health   Financial Resource Strain:   . Difficulty of Paying Living Expenses:   Food Insecurity:   . Worried About Programme researcher, broadcasting/film/video in the Last Year:   . Barista in the Last Year:   Transportation Needs:   . Freight forwarder (Medical):   Marland Kitchen Lack of Transportation (Non-Medical):   Physical Activity:   . Days of Exercise per Week:   . Minutes  of Exercise per Session:   Stress:   . Feeling of Stress :   Social Connections:   . Frequency of Communication with Friends and Family:   . Frequency of Social Gatherings with Friends and Family:   . Attends Religious Services:   . Active Member of Clubs or Organizations:   . Attends Banker Meetings:   Marland Kitchen Marital Status:   Intimate Partner Violence:   . Fear of Current or Ex-Partner:   . Emotionally Abused:   Marland Kitchen Physically Abused:   . Sexually Abused:     Outpatient Medications Prior to Visit  Medication Sig Dispense Refill  . divalproex (DEPAKOTE ER) 500 MG 24 hr tablet Take 500 mg by mouth daily. Take 1 tablets by mouth.    . cyclobenzaprine (FLEXERIL) 10 MG tablet Take 0.5-1 tablets (5-10 mg total) by mouth 3 (three) times daily as needed for muscle spasms. 30 tablet 0   No facility-administered medications prior to visit.    Allergies  Allergen Reactions  . Sumatriptan Other (See Comments)     REACTION: Passed out  . Clarithromycin Rash    REACTION: Rash    ROS Review of Systems    Objective:    Physical Exam Constitutional:      Appearance: She is well-developed.  HENT:     Head: Normocephalic and atraumatic.     Right Ear: Tympanic membrane, ear canal and external ear normal.     Left Ear: Tympanic membrane, ear canal and external ear normal.     Nose: Nose normal.     Mouth/Throat:     Mouth: Mucous membranes are moist.  Eyes:     Conjunctiva/sclera: Conjunctivae normal.     Pupils: Pupils are equal, round, and reactive to light.  Neck:     Thyroid: No thyromegaly.  Cardiovascular:     Rate and Rhythm: Normal rate and regular rhythm.     Heart sounds: Normal heart sounds.  Pulmonary:     Effort: Pulmonary effort is normal.     Breath sounds: Normal breath sounds. No wheezing.  Abdominal:     General: Bowel sounds are normal.     Palpations: Abdomen is soft. There is no hepatomegaly.  Musculoskeletal:     Cervical back: Neck supple.  Lymphadenopathy:     Cervical: No cervical adenopathy.  Skin:    General: Skin is warm and dry.  Neurological:     Mental Status: She is alert and oriented to person, place, and time.     BP 128/88   Pulse 86   Ht 5\' 9"  (1.753 m)   Wt 212 lb (96.2 kg)   SpO2 99%   BMI 31.31 kg/m  Wt Readings from Last 3 Encounters:  12/07/19 212 lb (96.2 kg)  11/29/19 (!) 207 lb 11.2 oz (94.2 kg)  07/06/19 205 lb (93 kg)     Health Maintenance Due  Topic Date Due  . Hepatitis C Screening  Never done  . HIV Screening  Never done  . COLONOSCOPY  Never done  . INFLUENZA VACCINE  12/02/2019    There are no preventive care reminders to display for this patient.  Lab Results  Component Value Date   TSH 6.30 (H) 01/12/2018   Lab Results  Component Value Date   WBC 5.2 01/12/2018   HGB 11.9 01/12/2018   HCT 35.0 01/12/2018   MCV 93.8 01/12/2018   PLT 229 01/12/2018   Lab Results  Component Value Date   NA 140  01/12/2018   K 4.5 01/12/2018   CO2 28 01/12/2018   GLUCOSE 92 01/12/2018   BUN 24 01/12/2018   CREATININE 1.00 01/12/2018   BILITOT 0.3 01/12/2018   ALKPHOS 82 11/30/2016   AST 31 01/12/2018   ALT 23 01/12/2018   PROT 6.5 01/12/2018   ALBUMIN 4.0 11/30/2016   CALCIUM 9.2 01/12/2018   Lab Results  Component Value Date   CHOL 186 01/13/2018   Lab Results  Component Value Date   HDL 61 01/13/2018   Lab Results  Component Value Date   LDLCALC 101 (H) 01/13/2018   Lab Results  Component Value Date   TRIG 142 01/13/2018   Lab Results  Component Value Date   CHOLHDL 3.0 01/13/2018   No results found for: HGBA1C    Assessment & Plan:   Problem List Items Addressed This Visit    None    Visit Diagnoses    Dizziness    -  Primary   Relevant Orders   EKG 12-Lead   COMPLETE METABOLIC PANEL WITH GFR   Lipid panel   CBC   DG Chest 2 View (Completed)   Lipase   Atypical chest pain       Relevant Orders   EKG 12-Lead   COMPLETE METABOLIC PANEL WITH GFR   Lipid panel   CBC   DG Chest 2 View (Completed)   Lipase   Stress at home         Atypical chest pain-she is low risk for cardiac disease overall no history of hypertension or significant hyperlipidemia and no prior history of cardiac disease.  I do think some this could be stress related but I had like to rule out anemia, thyroid disorder, electrolyte abnormalities.  Also because the pain is just below the breast and over the lower left rib area I also like to check a lipase.  We will also get chest x-ray for further work-up.  EKG today shows normal sinus rhythm with a rate of 82 bpm, no acute ST-T wave changes.  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 Dizziness-seems to occur with the chest discomfort but not always.  Sometimes with change of position.  Orthostatics were normal here in the office again blood pressure looks great it actually was not low.  She is not really on any new medications  that should be causing the side effects and she does not take any additional supplements.  Again unclear etiology consider further work-up.  We also discussed possibly getting a treadmill stress test and maybe even an echocardiogram.  She really feels like a lot of this is stress triggered so wants to just start with the initial work-up and then go from there.  Acute stress-we discussed this as well and potential treatment options.  I think she would do well with a support group.  Reaching out to other friends and family who can be supportive.  Making sure to take time for herself especially if there is a caregiver staying with her mother.  We also discussed possibly the use of medication to help if needed and/or therapy or counseling.  No orders of the defined types were placed in this encounter.   Follow-up: Return in about 4 weeks (around 01/04/2020) for recheck BP and chest pain. Nani Gasser, MD

## 2019-12-07 NOTE — Progress Notes (Signed)
Pt reports that she has had CP on/off x 2wks. She stated that it is not directly in the center of her chest it is more towards her R breast and it radiates. It does cause her to feel that she cannot get a breath. 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

## 2019-12-18 DIAGNOSIS — R0789 Other chest pain: Secondary | ICD-10-CM | POA: Diagnosis not present

## 2019-12-18 DIAGNOSIS — R42 Dizziness and giddiness: Secondary | ICD-10-CM | POA: Diagnosis not present

## 2019-12-19 LAB — COMPLETE METABOLIC PANEL WITH GFR
AG Ratio: 1.7 (calc) (ref 1.0–2.5)
ALT: 12 U/L (ref 6–29)
AST: 14 U/L (ref 10–35)
Albumin: 4.3 g/dL (ref 3.6–5.1)
Alkaline phosphatase (APISO): 86 U/L (ref 37–153)
BUN/Creatinine Ratio: 30 (calc) — ABNORMAL HIGH (ref 6–22)
BUN: 26 mg/dL — ABNORMAL HIGH (ref 7–25)
CO2: 28 mmol/L (ref 20–32)
Calcium: 9.3 mg/dL (ref 8.6–10.4)
Chloride: 103 mmol/L (ref 98–110)
Creat: 0.86 mg/dL (ref 0.50–1.05)
GFR, Est African American: 87 mL/min/{1.73_m2} (ref >=60)
GFR, Est Non African American: 75 mL/min/{1.73_m2} (ref >=60)
Globulin: 2.5 g/dL (calc) (ref 1.9–3.7)
Glucose, Bld: 84 mg/dL (ref 65–99)
Potassium: 4.7 mmol/L (ref 3.5–5.3)
Sodium: 138 mmol/L (ref 135–146)
Total Bilirubin: 0.4 mg/dL (ref 0.2–1.2)
Total Protein: 6.8 g/dL (ref 6.1–8.1)

## 2019-12-19 LAB — CBC
HCT: 38.3 % (ref 35.0–45.0)
Hemoglobin: 12.7 g/dL (ref 11.7–15.5)
MCH: 31 pg (ref 27.0–33.0)
MCHC: 33.2 g/dL (ref 32.0–36.0)
MCV: 93.4 fL (ref 80.0–100.0)
MPV: 11 fL (ref 7.5–12.5)
Platelets: 323 10*3/uL (ref 140–400)
RBC: 4.1 10*6/uL (ref 3.80–5.10)
RDW: 12.1 % (ref 11.0–15.0)
WBC: 5.9 10*3/uL (ref 3.8–10.8)

## 2019-12-19 LAB — LIPASE: Lipase: 26 U/L (ref 7–60)

## 2019-12-19 LAB — LIPID PANEL
Cholesterol: 238 mg/dL — ABNORMAL HIGH (ref <200)
HDL: 58 mg/dL (ref >=50)
LDL Cholesterol (Calc): 144 mg/dL (calc) — ABNORMAL HIGH
Non-HDL Cholesterol (Calc): 180 mg/dL (calc) — ABNORMAL HIGH (ref <130)
Total CHOL/HDL Ratio: 4.1 (calc) (ref <5.0)
Triglycerides: 222 mg/dL — ABNORMAL HIGH (ref <150)

## 2020-01-04 ENCOUNTER — Ambulatory Visit: Payer: BC Managed Care – PPO | Admitting: Family Medicine

## 2020-01-16 ENCOUNTER — Ambulatory Visit: Payer: BC Managed Care – PPO

## 2020-01-18 ENCOUNTER — Ambulatory Visit: Payer: BC Managed Care – PPO | Admitting: Family Medicine

## 2020-02-05 ENCOUNTER — Ambulatory Visit: Payer: BC Managed Care – PPO | Admitting: Family Medicine

## 2020-02-06 ENCOUNTER — Ambulatory Visit: Payer: BC Managed Care – PPO

## 2020-04-01 DIAGNOSIS — G43709 Chronic migraine without aura, not intractable, without status migrainosus: Secondary | ICD-10-CM | POA: Diagnosis not present

## 2020-11-25 DIAGNOSIS — Z135 Encounter for screening for eye and ear disorders: Secondary | ICD-10-CM | POA: Diagnosis not present

## 2020-11-25 DIAGNOSIS — H524 Presbyopia: Secondary | ICD-10-CM | POA: Diagnosis not present

## 2020-11-25 DIAGNOSIS — H52223 Regular astigmatism, bilateral: Secondary | ICD-10-CM | POA: Diagnosis not present

## 2021-04-01 DIAGNOSIS — G43709 Chronic migraine without aura, not intractable, without status migrainosus: Secondary | ICD-10-CM | POA: Diagnosis not present

## 2021-04-01 DIAGNOSIS — F39 Unspecified mood [affective] disorder: Secondary | ICD-10-CM | POA: Diagnosis not present

## 2021-04-01 DIAGNOSIS — Z79899 Other long term (current) drug therapy: Secondary | ICD-10-CM | POA: Diagnosis not present

## 2021-04-01 DIAGNOSIS — I499 Cardiac arrhythmia, unspecified: Secondary | ICD-10-CM | POA: Diagnosis not present

## 2021-05-11 ENCOUNTER — Other Ambulatory Visit: Payer: Self-pay

## 2021-05-11 ENCOUNTER — Ambulatory Visit: Payer: BC Managed Care – PPO | Admitting: Family Medicine

## 2021-05-11 ENCOUNTER — Encounter: Payer: Self-pay | Admitting: Family Medicine

## 2021-05-11 VITALS — BP 146/88 | HR 70 | Temp 99.0°F | Ht 69.0 in | Wt 211.1 lb

## 2021-05-11 DIAGNOSIS — J0140 Acute pansinusitis, unspecified: Secondary | ICD-10-CM

## 2021-05-11 DIAGNOSIS — R051 Acute cough: Secondary | ICD-10-CM | POA: Diagnosis not present

## 2021-05-11 DIAGNOSIS — Z1231 Encounter for screening mammogram for malignant neoplasm of breast: Secondary | ICD-10-CM

## 2021-05-11 MED ORDER — BENZONATATE 200 MG PO CAPS: 200.0000 mg | ORAL_CAPSULE | Freq: Two times a day (BID) | ORAL | 0 refills | Status: AC | PRN

## 2021-05-11 MED ORDER — HYDROCOD POLST-CPM POLST ER 10-8 MG/5ML PO SUER: 5.0000 mL | Freq: Two times a day (BID) | ORAL | 0 refills | Status: AC | PRN

## 2021-05-11 MED ORDER — AMOXICILLIN-POT CLAVULANATE 875-125 MG PO TABS: 1.0000 | ORAL_TABLET | Freq: Two times a day (BID) | ORAL | 0 refills | Status: AC

## 2021-05-11 NOTE — Progress Notes (Signed)
Acute Office Visit  Subjective:    Patient ID: Jessica Alexander, female    DOB: 07-16-1960, 61 y.o.   MRN: [id number]  Chief Complaint  Patient presents with   Cough    X 1 mth   Laryngitis    X 2 weeks     Patient is in today for cough, sinus pressure.   Patient is going on 3 weeks of cough and left-sided sinus symptoms. States symptoms got significantly worse last week and she did have a few days of fever, but tested negative for COVID. She reports her throat is sore from all of the coughing and her voice is hoarse. Cough has been productive with occasional yellow sputum. States that the coughing can get severe enough that she has vomited a few times. Her chest is sore from coughing, but no other chest pains. Her left frontal and maxillary sinuses are painful/congested and she has some left ear discomfort as well. She has felt more fatigued lately as well. She denies any chest pain, dyspnea, wheezing, tachycardia, current fevers, GI/GU symptoms. She has been using Robitussin, Mucinex, ibuprofen and tylenol.        Past Medical History:  Diagnosis Date   Endometriosis    Pneumonia    WPW syndrome     Past Surgical History:  Procedure Laterality Date   FOOT SURGERY  06/2010   uterine tac  2001   VAGINAL HYSTERECTOMY  2003    Family History  Problem Relation Age of Onset   Hypertension Mother    Stroke Mother        x 2   Hypertension Father    Alzheimer's disease Father     Social History   Socioeconomic History   Marital status: Married    Spouse name: Not on file   Number of children: Not on file   Years of education: Not on file   Highest education level: Not on file  Occupational History   Occupation: retired    Associate Professor: [employer]  Tobacco Use   Smoking status: Never   Smokeless tobacco: Never  Substance and Sexual Activity   Alcohol use: Yes    Comment: rarely   Drug use: No   Sexual activity: Yes    Partners: Male  Other Topics Concern    Not on file  Social History Narrative   Not on file   Social Determinants of Health   Financial Resource Strain: Not on file  Food Insecurity: Not on file  Transportation Needs: Not on file  Physical Activity: Not on file  Stress: Not on file  Social Connections: Not on file  Intimate Partner Violence: Not on file    Outpatient Medications Prior to Visit  Medication Sig Dispense Refill   divalproex (DEPAKOTE ER) 500 MG 24 hr tablet Take 500 mg by mouth daily. Take 1 tablets by mouth.     escitalopram (LEXAPRO) 5 MG tablet Take 1 tablet x 2 week then increase to 2 tablets thereafter.     No facility-administered medications prior to visit.    Allergies  Allergen Reactions   Sumatriptan Other (See Comments)    REACTION: Passed out   Clarithromycin Rash    REACTION: Rash    Review of Systems  Respiratory:  Positive for cough.   All review of systems negative except what is listed in the HPI     Objective:    Physical Exam Vitals reviewed.  Constitutional:      Appearance: Normal appearance.  HENT:     Right Ear: Tympanic membrane normal.     Left Ear: Tympanic membrane normal.  Cardiovascular:     Rate and Rhythm: Normal rate and regular rhythm.     Pulses: Normal pulses.  Pulmonary:     Effort: Pulmonary effort is normal.     Breath sounds: Normal breath sounds. No wheezing, rhonchi or rales.  Musculoskeletal:     Cervical back: Normal range of motion and neck supple.  Skin:    General: Skin is warm and dry.  Neurological:     General: No focal deficit present.     Mental Status: She is alert and oriented to person, place, and time. Mental status is at baseline.  Psychiatric:        Mood and Affect: Mood normal.        Behavior: Behavior normal.        Thought Content: Thought content normal.        Judgment: Judgment normal.    BP (!) 146/88 (BP Location: Left Arm, Patient Position: Sitting, Cuff Size: Normal)    Pulse 70    Temp 99 F (37.2 C)  (Oral)    Ht 5\' 9"  (1.753 m)    Wt 211 lb 1.9 oz (95.8 kg)    SpO2 95%    BMI 31.18 kg/m  Wt Readings from Last 3 Encounters:  05/11/21 211 lb 1.9 oz (95.8 kg)  12/07/19 212 lb (96.2 kg)  11/29/19 (!) 207 lb 11.2 oz (94.2 kg)    Health Maintenance Due  Topic Date Due   HIV Screening  Never done   Hepatitis C Screening  Never done   COLONOSCOPY (Pts [id number] Insurance coverage will need to be confirmed)  Never done   MAMMOGRAM  04/05/2020    There are no preventive care reminders to display for this patient.   Lab Results  Component Value Date   TSH 6.30 (H) 01/12/2018   Lab Results  Component Value Date   WBC 5.9 12/18/2019   HGB 12.7 12/18/2019   HCT 38.3 12/18/2019   MCV 93.4 12/18/2019   PLT 323 12/18/2019   Lab Results  Component Value Date   NA 138 12/18/2019   K 4.7 12/18/2019   CO2 28 12/18/2019   GLUCOSE 84 12/18/2019   BUN 26 (H) 12/18/2019   CREATININE 0.86 12/18/2019   BILITOT 0.4 12/18/2019   ALKPHOS 82 11/30/2016   AST 14 12/18/2019   ALT 12 12/18/2019   PROT 6.8 12/18/2019   ALBUMIN 4.0 11/30/2016   CALCIUM 9.3 12/18/2019   Lab Results  Component Value Date   CHOL 238 (H) 12/18/2019   Lab Results  Component Value Date   HDL 58 12/18/2019   Lab Results  Component Value Date   LDLCALC 144 (H) 12/18/2019   Lab Results  Component Value Date   TRIG 222 (H) 12/18/2019   Lab Results  Component Value Date   CHOLHDL 4.1 12/18/2019   No results found for: HGBA1C     Assessment & Plan:    1. Acute non-recurrent pansinusitis 2. Acute cough Given duration, treating with antibiotics. Adding Tessalon and Tussionex for cough. Continue supportive measures including rest, hydration, humidifier use, steam showers, warm compresses to sinuses, warm liquids with lemon and honey, and over-the-counter cough, cold, and analgesics as needed.  Patient aware of signs/symptoms requiring further/urgent evaluation.   - amoxicillin-clavulanate (AUGMENTIN)  875-125 MG tablet; Take 1 tablet by mouth 2 (two) times daily for 10 days.  Dispense: 20 tablet; Refill: 0 - chlorpheniramine-HYDROcodone (TUSSIONEX) 10-8 MG/5ML SUER; Take 5 mLs by mouth every 12 (twelve) hours as needed for up to 5 days for cough (cough, will cause drowsiness.).  Dispense: 50 mL; Refill: 0 - benzonatate (TESSALON) 200 MG capsule; Take 1 capsule (200 mg total) by mouth 2 (two) times daily as needed for cough.  Dispense: 20 capsule; Refill: 0   Follow-up if symptoms worsen or fail to improve.    Clayborne Dana, NP

## 2021-05-12 ENCOUNTER — Ambulatory Visit: Payer: BC Managed Care – PPO | Admitting: Family Medicine

## 2021-07-08 NOTE — Addendum Note (Signed)
Addended by: Nani Gasser D on: 07/08/2021 03:36 PM   Modules accepted: Orders

## 2021-08-20 ENCOUNTER — Ambulatory Visit: Payer: BC Managed Care – PPO
